# Patient Record
Sex: Male | Born: 1935 | Race: White | Hispanic: No | Marital: Married | Smoking: Former smoker
Health system: Southern US, Community
[De-identification: ages and names within clinical notes are randomized; demographics above are authoritative.]

## PROBLEM LIST (undated history)

## (undated) DIAGNOSIS — C679 Malignant neoplasm of bladder, unspecified: Secondary | ICD-10-CM

## (undated) DIAGNOSIS — I10 Essential (primary) hypertension: Secondary | ICD-10-CM

## (undated) HISTORY — DX: Malignant neoplasm of bladder, unspecified: C67.9

## (undated) HISTORY — PX: CYSTOSTOMY W/ BLADDER BIOPSY: SHX1431

---

## 2005-02-28 ENCOUNTER — Ambulatory Visit: Payer: Self-pay | Admitting: Internal Medicine

## 2005-03-18 ENCOUNTER — Ambulatory Visit (HOSPITAL_BASED_OUTPATIENT_CLINIC_OR_DEPARTMENT_OTHER): Admission: RE | Admit: 2005-03-18 | Discharge: 2005-03-18 | Payer: Self-pay | Admitting: Internal Medicine

## 2005-03-24 ENCOUNTER — Ambulatory Visit: Payer: Self-pay | Admitting: Internal Medicine

## 2005-04-01 ENCOUNTER — Ambulatory Visit: Payer: Self-pay | Admitting: Internal Medicine

## 2005-05-01 ENCOUNTER — Ambulatory Visit: Payer: Self-pay | Admitting: Internal Medicine

## 2005-08-29 ENCOUNTER — Ambulatory Visit: Payer: Self-pay | Admitting: Internal Medicine

## 2007-03-03 ENCOUNTER — Ambulatory Visit: Payer: Self-pay | Admitting: Vascular Surgery

## 2007-03-03 ENCOUNTER — Ambulatory Visit (HOSPITAL_COMMUNITY): Admission: RE | Admit: 2007-03-03 | Discharge: 2007-03-03 | Payer: Self-pay | Admitting: Family Medicine

## 2010-04-26 ENCOUNTER — Encounter: Payer: Self-pay | Admitting: Internal Medicine

## 2010-12-25 NOTE — Letter (Signed)
Summary: [facility] For Supplies / [employer]  [facility] For Supplies / [facility]   Imported By: Lennie Odor 05/01/2010 14:45:16  _____________________________________________________________________  External Attachment:    Type:   Image     Comment:   External Document

## 2013-01-01 ENCOUNTER — Other Ambulatory Visit: Payer: Self-pay | Admitting: Oncology

## 2013-01-01 DIAGNOSIS — C679 Malignant neoplasm of bladder, unspecified: Secondary | ICD-10-CM

## 2013-01-04 ENCOUNTER — Telehealth: Payer: Self-pay | Admitting: Oncology

## 2013-01-04 NOTE — Telephone Encounter (Signed)
C/D 01/04/13 for appt 01/05/13

## 2013-01-04 NOTE — Telephone Encounter (Signed)
S/W PT WIFE ON NP APPT. ON 01/05/13@10 :30 REFERRING DR MILOWSKY-[facility] DX-BLADDER CA NO NEED TO MAIL NP PACKET

## 2013-01-05 ENCOUNTER — Other Ambulatory Visit: Payer: Medicare Other

## 2013-01-05 ENCOUNTER — Encounter: Payer: Self-pay | Admitting: Oncology

## 2013-01-05 ENCOUNTER — Ambulatory Visit (HOSPITAL_BASED_OUTPATIENT_CLINIC_OR_DEPARTMENT_OTHER): Payer: Medicare Other | Admitting: Oncology

## 2013-01-05 ENCOUNTER — Ambulatory Visit: Payer: Medicare Other

## 2013-01-05 ENCOUNTER — Telehealth: Payer: Self-pay | Admitting: Oncology

## 2013-01-05 ENCOUNTER — Other Ambulatory Visit (HOSPITAL_BASED_OUTPATIENT_CLINIC_OR_DEPARTMENT_OTHER): Payer: Medicare Other | Admitting: Lab

## 2013-01-05 ENCOUNTER — Encounter: Payer: Self-pay | Admitting: *Deleted

## 2013-01-05 VITALS — BP 142/53 | HR 40 | Temp 97.2°F | Resp 20 | Ht 71.0 in | Wt 175.1 lb

## 2013-01-05 DIAGNOSIS — I498 Other specified cardiac arrhythmias: Secondary | ICD-10-CM

## 2013-01-05 DIAGNOSIS — C679 Malignant neoplasm of bladder, unspecified: Secondary | ICD-10-CM

## 2013-01-05 DIAGNOSIS — C677 Malignant neoplasm of urachus: Secondary | ICD-10-CM

## 2013-01-05 LAB — CBC WITH DIFFERENTIAL/PLATELET
Basophils Absolute: 0 10*3/uL (ref 0.0–0.1)
EOS%: 2.4 % (ref 0.0–7.0)
HCT: 39.9 % (ref 38.4–49.9)
HGB: 13.4 g/dL (ref 13.0–17.1)
MCH: 30.7 pg (ref 27.2–33.4)
MCV: 91 fL (ref 79.3–98.0)
MONO%: 9.3 % (ref 0.0–14.0)
NEUT%: 64.2 % (ref 39.0–75.0)

## 2013-01-05 LAB — COMPREHENSIVE METABOLIC PANEL (CC13)
AST: 20 U/L (ref 5–34)
Alkaline Phosphatase: 67 U/L (ref 40–150)
BUN: 20.8 mg/dL (ref 7.0–26.0)
Calcium: 10.1 mg/dL (ref 8.4–10.4)
Creatinine: 1.3 mg/dL (ref 0.7–1.3)

## 2013-01-05 MED ORDER — ONDANSETRON HCL 8 MG PO TABS: 8.0000 mg | ORAL_TABLET | Freq: Three times a day (TID) | ORAL | Status: DC | PRN

## 2013-01-05 NOTE — Progress Notes (Signed)
Checked in new pt with no financial concerns. °

## 2013-01-05 NOTE — Progress Notes (Signed)
Note dictated

## 2013-01-05 NOTE — Telephone Encounter (Signed)
gv and printed appt schedule for pt for Feb March and April....s/w and emailed michelle

## 2013-01-06 NOTE — Progress Notes (Signed)
CC:   Barbie Haggis, MD, Fax [phone] Donnella Bi, MD, Fax [phone] [facility], Fax [phone]  REASON FOR CONSULTATION:  Bladder cancer.  HISTORY OF PRESENT ILLNESS:  Mr. Michael Ball is a pleasant 77 year old gentleman currently of [place], lived the majority of his life around this area.  He is retired from [employer] and has been for quite a while. He is a relatively healthy gentleman with a history of mild hypertension.  Has also history of bradycardia and follows up with Cardiology on a regular basis.  He was in his usual state of health until he presented to his primary care physician in June of 2013 with a problem with urination.  He has had stop and start symptoms, some hesitancy at times but no gross hematuria.  A urinalysis in June of 2013 showed microscopic hematuria.  At that time he was evaluated by a urologist at [facility] and a diagnosis of superficial bladder tumor was made after a biopsy.  At that time treated with 6 treatments of BCG that concluded in November of 2013.  He had a repeat biopsy that was done on 12/23/2012.  The case number was [id number] which showed atypical urothelial cells suspicious for malignancy.  That was the cytology report.  The actual surgical pathology was done on 12/15/2012 and that was case number [id number] and showed urothelial carcinoma high-grade, papillary, noninvasive.  The lamina propria is present but without invasion of tumor.  No muscularis propria is present in the specimens. Based on these findings, the patient had a CT scan done on 12/28/2012 which showed a mildly enlarged retroperitoneal adenopathy with infrarenal aortocaval node measuring 1.4 cm and a left suprarenal periaortic lymph node measuring 1.1 cm.  Based on these findings the patient had consultation with Dr. Donnella Bi who felt that the patient has a T3 urothelial carcinoma of the bladder with possible retroperitoneal lymph node involvements;  recommended a salvage cystectomy and lymphadenectomy and possible neoadjuvant chemotherapy. The patient was also referred to Dr. Max Fickle who recommended neoadjuvant chemotherapy and the patient was referred to me for evaluation and possibly getting his chemotherapy closer to home.  Upon my interviewing Mr. Fahrney, he feels relatively fair.  He does report some intermittent hematuria but did not report any abdominal pain.  He did not report any back pain.  Had not reported any decline in his energy or performance status.  Continued to be in relatively good health and shape.  He still drives and attends to activities of daily living without any hindrance or decline.  REVIEW OF SYSTEMS:  Not reporting any headaches, blurred vision, double vision.  Not reporting any motor or sensory neuropathy.  Not reporting any alteration in mental status.  Not reporting any psychiatric issues or depression.  Not reporting any fever, chills, sweats.  Not reporting any cough, hemoptysis, hematemesis.  No nausea, vomiting.  Not reporting any abdominal pain.  No hematochezia.  No melena.  No frequency, urgency or hesitancy.  No musculoskeletal complaints.  No arthralgias, myalgias. Rest of review of systems unremarkable.  PAST MEDICAL HISTORY:  Significant for hyperlipidemia, hypertension, history of bradycardia.  Did not have any history of diabetes, coronary artery disease or any surgeries.  MEDICATIONS:  He is on amlodipine, Hyzaar, Crestor, Restoril.  ALLERGIES:  None.  SOCIAL HISTORY:  He is married.  He is retired.  He is a former smoker but quit recently.  Denies alcohol or tobacco abuse.  FAMILY HISTORY:  His mother had lung cancer.  Father  died of motor vehicle accident in his 29s.  No history of any bladder cancer.  No history of any prostate cancer.  PHYSICAL EXAMINATION:  General:  Alert, awake gentleman, appeared in no active distress.  Vital signs:  His blood pressure is 142/53, pulse  is 40, respiration 20, temperature is 97, he is afebrile.  HEENT:  Head is normocephalic, atraumatic.  Pupils equal, round, reactive to light. Oral mucosa moist and pink.  Neck:  Supple.  No lymphadenopathy.  Heart: Regular rate and rhythm.  S1, S2.  Lungs:  Clear to auscultation. Abdomen:  Soft, nontender.  No hepatosplenomegaly.  Extremities:  Had no clubbing, cyanosis or edema.  Neurological:  Intact motor and sensory and deep tendon reflexes.  LABORATORY DATA:  Showed a hemoglobin of 13, white cell count of 6.3, platelet count of 173.  His creatinine is 1.3.  He has normal liver function tests and bilirubin.  ASSESSMENT AND PLAN:  This is a pleasant 77 year old gentleman with the following findings: 1. Urothelial carcinoma of the bladder, high-grade, although it does     not appear to be muscle invasive on the recent specimen.  There is     a questionable lymphadenopathy on the CT scan in the     retroperitoneal and the periaortic area largest of which measuring     1.4 cm.  At this time I do agree with assessment that this     represents a possibly high risk bladder tumor that not only have     recurred from an in situ tumor to possibly invasive tumor that     possibly also have spread into regional lymph nodes.  I do agree     that given the aggressive nature of his cancer, neoadjuvant     chemotherapy followed by cystectomy would be a reasonable approach.     I have discussed with him the risks and benefits of using a     neoadjuvant cisplatin based chemotherapy.  Despite his age I feel     he is a very good cisplatin candidate, I think his creatinine     clearance over more than 60, he has good functional status and no     evidence of history of coronary artery disease.  All of these     parameters support the use of cisplatin at this point.  I believe     Gemzar with cisplatin would be a reasonable choice to be done on     day 1 and with Gemzar day 8 for a total of 3 cycles.   I gave him     the potential benefits between 5%-10% improvement, progression free     survival and overall survival potentially.  I also discussed with     him the role of adjuvant chemotherapy if he elects not to proceed     with neoadjuvant chemotherapy.  I explained to him that the role     has not been well established in the adjuvant setting.  The     possibility of difficulty administration of chemotherapy in this     particular setting as well as the increased complication rate.     Toxicity of this combination of chemotherapy was discussed today in     detail.  It includes nausea, vomiting, myelosuppression, infusion     related problems, renal toxicity, nephrotoxicity, auditory toxicity     and hearing loss.  More importantly neutropenia, neutropenic     sepsis, possible need for hospitalization and  intravenous     antibiotics and rarely severe illness, morbidity and death.  I also     talked to him about possible need for blood product transfusion as     well as growth factor support.  I have also discussed with him the     risks and benefits of having a Port-A-Catheter.  At this time I     think it reasonable to proceed with peripheral IVs.  He will get a     chemotherapy education class and potentially start his chemotherapy     next week.  He is agreeable obviously to proceed at this time and I     have talked to him about staging workup including a PET-CT scan.     At this time it will be difficult to get that approved given his     current diagnosis and he wanted to forego this particular step and     I think that is reasonable.  All his questions were answered today     and he was ready to proceed. 2. Bradycardia.  Again, he has been followed by his cardiologist     pretty regularly, that is Dr. Jeanella Cara, and at this time appears to     be asymptomatic.    ______________________________ Benjiman Core, M.D. FNS/MEDQ  D:  01/05/2013  T:  01/05/2013  Job:  [id number]

## 2013-01-12 ENCOUNTER — Other Ambulatory Visit: Payer: Self-pay | Admitting: *Deleted

## 2013-01-12 ENCOUNTER — Other Ambulatory Visit (HOSPITAL_BASED_OUTPATIENT_CLINIC_OR_DEPARTMENT_OTHER): Payer: Medicare Other | Admitting: Lab

## 2013-01-12 ENCOUNTER — Ambulatory Visit (HOSPITAL_BASED_OUTPATIENT_CLINIC_OR_DEPARTMENT_OTHER): Payer: 59

## 2013-01-12 VITALS — BP 128/45 | HR 44 | Temp 98.2°F

## 2013-01-12 DIAGNOSIS — C677 Malignant neoplasm of urachus: Secondary | ICD-10-CM

## 2013-01-12 DIAGNOSIS — Z5111 Encounter for antineoplastic chemotherapy: Secondary | ICD-10-CM

## 2013-01-12 DIAGNOSIS — C679 Malignant neoplasm of bladder, unspecified: Secondary | ICD-10-CM

## 2013-01-12 LAB — CBC WITH DIFFERENTIAL/PLATELET
BASO%: 0.6 % (ref 0.0–2.0)
Basophils Absolute: 0 10e3/uL (ref 0.0–0.1)
EOS%: 2 % (ref 0.0–7.0)
Eosinophils Absolute: 0.1 10e3/uL (ref 0.0–0.5)
HCT: 38.2 % — ABNORMAL LOW (ref 38.4–49.9)
HGB: 12.8 g/dL — ABNORMAL LOW (ref 13.0–17.1)
LYMPH%: 28.1 % (ref 14.0–49.0)
MCH: 30.5 pg (ref 27.2–33.4)
MCHC: 33.5 g/dL (ref 32.0–36.0)
MCV: 91 fL (ref 79.3–98.0)
MONO#: 0.7 10e3/uL (ref 0.1–0.9)
MONO%: 10.7 % (ref 0.0–14.0)
NEUT#: 4 10e3/uL (ref 1.5–6.5)
NEUT%: 58.6 % (ref 39.0–75.0)
Platelets: 168 10e3/uL (ref 140–400)
RBC: 4.2 10e6/uL (ref 4.20–5.82)
RDW: 13.7 % (ref 11.0–14.6)
WBC: 6.8 10e3/uL (ref 4.0–10.3)
lymph#: 1.9 10e3/uL (ref 0.9–3.3)
nRBC: 0 % (ref 0–0)

## 2013-01-12 LAB — COMPREHENSIVE METABOLIC PANEL (CC13)
Albumin: 4 g/dL (ref 3.5–5.0)
Alkaline Phosphatase: 70 U/L (ref 40–150)
CO2: 26 mEq/L (ref 22–29)
Chloride: 105 mEq/L (ref 98–107)
Potassium: 3.5 mEq/L (ref 3.5–5.1)
Sodium: 143 mEq/L (ref 136–145)

## 2013-01-12 MED ORDER — SODIUM CHLORIDE 0.9 % IV SOLN
70.0000 mg/m2 | Freq: Once | INTRAVENOUS | Status: AC
  Administered 2013-01-12: 140 mg via INTRAVENOUS
  Filled 2013-01-12: qty 140

## 2013-01-12 MED ORDER — SODIUM CHLORIDE 0.9 % IV SOLN
1000.0000 mg/m2 | Freq: Once | INTRAVENOUS | Status: AC
  Administered 2013-01-12: 1976 mg via INTRAVENOUS
  Filled 2013-01-12: qty 52

## 2013-01-12 MED ORDER — PALONOSETRON HCL INJECTION 0.25 MG/5ML
0.2500 mg | Freq: Once | INTRAVENOUS | Status: AC
  Administered 2013-01-12: 0.25 mg via INTRAVENOUS

## 2013-01-12 MED ORDER — PROCHLORPERAZINE MALEATE 10 MG PO TABS: 10.0000 mg | ORAL_TABLET | Freq: Four times a day (QID) | ORAL | Status: DC | PRN

## 2013-01-12 MED ORDER — SODIUM CHLORIDE 0.9 % IV SOLN
INTRAVENOUS | Status: DC
  Administered 2013-01-12: 10:00:00 via INTRAVENOUS

## 2013-01-12 MED ORDER — SODIUM CHLORIDE 0.9 % IV SOLN
150.0000 mg | Freq: Once | INTRAVENOUS | Status: AC
  Administered 2013-01-12: 150 mg via INTRAVENOUS
  Filled 2013-01-12: qty 5

## 2013-01-12 MED ORDER — DEXAMETHASONE SODIUM PHOSPHATE 4 MG/ML IJ SOLN
12.0000 mg | Freq: Once | INTRAMUSCULAR | Status: AC
  Administered 2013-01-12: 12 mg via INTRAVENOUS

## 2013-01-12 MED ORDER — POTASSIUM CHLORIDE 2 MEQ/ML IV SOLN
Freq: Once | INTRAVENOUS | Status: AC
  Administered 2013-01-12: 10:00:00 via INTRAVENOUS
  Filled 2013-01-12: qty 10

## 2013-01-12 NOTE — Progress Notes (Signed)
Dr. Clelia Croft notified of pt's BP and HR today, verbal order received and read back ok to proceed with treatment.  Pt states his BP medication may need to be adjusted since the diastolic is becoming lower, he has noticed.  Informed pt he could call his primary care MD office to discuss this further.  He verbalizes understanding.

## 2013-01-12 NOTE — Patient Instructions (Addendum)
 Cancer Center Discharge Instructions for Patients Receiving Chemotherapy  Today you received the following chemotherapy agents: Gemzar, Cisplatin  To help prevent nausea and vomiting after your treatment, we encourage you to take your nausea medication as directed by your MD.  If you develop nausea and vomiting that is not controlled by your nausea medication, call the clinic. If it is after clinic hours your family physician or the after hours number for the clinic or go to the Emergency Department.   BELOW ARE SYMPTOMS THAT SHOULD BE REPORTED IMMEDIATELY:  *FEVER GREATER THAN 100.5 F  *CHILLS WITH OR WITHOUT FEVER  NAUSEA AND VOMITING THAT IS NOT CONTROLLED WITH YOUR NAUSEA MEDICATION  *UNUSUAL SHORTNESS OF BREATH  *UNUSUAL BRUISING OR BLEEDING  TENDERNESS IN MOUTH AND THROAT WITH OR WITHOUT PRESENCE OF ULCERS  *URINARY PROBLEMS  *BOWEL PROBLEMS  UNUSUAL RASH Items with * indicate a potential emergency and should be followed up as soon as possible.  One of the nurses will contact you 24 hours after your treatment. Please let the nurse know about any problems that you may have experienced. Feel free to call the clinic you have any questions or concerns. The clinic phone number is [phone].

## 2013-01-12 NOTE — Progress Notes (Signed)
1230- pre-cisplatin- Pt has voided 475 ml urine, and went an additional time that he forgot to measure, thinks it was about 150 ml.

## 2013-01-13 ENCOUNTER — Telehealth: Payer: Self-pay | Admitting: *Deleted

## 2013-01-13 NOTE — Telephone Encounter (Signed)
Message copied by Augusto Garbe on Wed Jan 13, 2013 11:35 AM ------      Message from: Faith Rogue F      Created: Tue Jan 12, 2013  1:49 PM      Regarding: chemo f/u call       Gemzar, Cisplatin            Dr. Clelia Croft             ------

## 2013-01-13 NOTE — Telephone Encounter (Signed)
Patient is doing well.  Denies any side effects or symptoms.  Reports Eating and drinking lots of water.  "No change or problem other than normal with bladder and bowels."  Not emptying as often but I am going.  No pain or burning.  Reports history of ringing in his ears for years.  Denies trouble hearing.  Has an open room in his house and has walked a few laps for exercise.  Asked about his Bowling league.  Plans to bowl Friday.  Says he will keep everything clean and avoid the crowds, has his own ball he wipes down with alcohol.  Denies any further questions.  Asked that he call if needed otherwise we'll see him 01-19-2013.

## 2013-01-19 ENCOUNTER — Ambulatory Visit (HOSPITAL_BASED_OUTPATIENT_CLINIC_OR_DEPARTMENT_OTHER): Payer: Medicare Other

## 2013-01-19 ENCOUNTER — Ambulatory Visit (HOSPITAL_BASED_OUTPATIENT_CLINIC_OR_DEPARTMENT_OTHER): Payer: Medicare Other | Admitting: Oncology

## 2013-01-19 ENCOUNTER — Other Ambulatory Visit (HOSPITAL_BASED_OUTPATIENT_CLINIC_OR_DEPARTMENT_OTHER): Payer: Medicare Other | Admitting: Lab

## 2013-01-19 ENCOUNTER — Encounter: Payer: Self-pay | Admitting: Oncology

## 2013-01-19 DIAGNOSIS — C679 Malignant neoplasm of bladder, unspecified: Secondary | ICD-10-CM

## 2013-01-19 DIAGNOSIS — Z5111 Encounter for antineoplastic chemotherapy: Secondary | ICD-10-CM

## 2013-01-19 LAB — COMPREHENSIVE METABOLIC PANEL (CC13)
Albumin: 3.7 g/dL (ref 3.5–5.0)
CO2: 26 mEq/L (ref 22–29)
Calcium: 9.3 mg/dL (ref 8.4–10.4)
Glucose: 95 mg/dl (ref 70–99)
Potassium: 3.6 mEq/L (ref 3.5–5.1)
Sodium: 138 mEq/L (ref 136–145)
Total Bilirubin: 0.82 mg/dL (ref 0.20–1.20)
Total Protein: 7.1 g/dL (ref 6.4–8.3)

## 2013-01-19 LAB — CBC WITH DIFFERENTIAL/PLATELET
Basophils Absolute: 0 10*3/uL (ref 0.0–0.1)
Eosinophils Absolute: 0.1 10*3/uL (ref 0.0–0.5)
HGB: 12.3 g/dL — ABNORMAL LOW (ref 13.0–17.1)
MCV: 88.4 fL (ref 79.3–98.0)
MONO%: 5 % (ref 0.0–14.0)
NEUT#: 2.1 10*3/uL (ref 1.5–6.5)
RDW: 13.2 % (ref 11.0–14.6)

## 2013-01-19 MED ORDER — SODIUM CHLORIDE 0.9 % IV SOLN
Freq: Once | INTRAVENOUS | Status: AC
  Administered 2013-01-19: 10:00:00 via INTRAVENOUS

## 2013-01-19 MED ORDER — ONDANSETRON 8 MG/50ML IVPB (CHCC)
8.0000 mg | Freq: Once | INTRAVENOUS | Status: AC
  Administered 2013-01-19: 8 mg via INTRAVENOUS

## 2013-01-19 MED ORDER — SODIUM CHLORIDE 0.9 % IV SOLN
1000.0000 mg/m2 | Freq: Once | INTRAVENOUS | Status: AC
  Administered 2013-01-19: 1976 mg via INTRAVENOUS
  Filled 2013-01-19: qty 52.03

## 2013-01-19 MED ORDER — DEXAMETHASONE SODIUM PHOSPHATE 10 MG/ML IJ SOLN
10.0000 mg | Freq: Once | INTRAMUSCULAR | Status: AC
  Administered 2013-01-19: 10 mg via INTRAVENOUS

## 2013-01-19 NOTE — Patient Instructions (Addendum)
[facility] Discharge Instructions for Patients Receiving Chemotherapy  Today you received the following chemotherapy agent Gemzar  To help prevent nausea and vomiting after your treatment, we encourage you to take your nausea medication. Begin taking your nausea medication as often as prescribed for by Dr. Clelia Croft.   If you develop nausea and vomiting that is not controlled by your nausea medication, call the clinic. If it is after clinic hours your family physician or the after hours number for the clinic or go to the Emergency Department.   BELOW ARE SYMPTOMS THAT SHOULD BE REPORTED IMMEDIATELY:  *FEVER GREATER THAN 100.5 F  *CHILLS WITH OR WITHOUT FEVER  NAUSEA AND VOMITING THAT IS NOT CONTROLLED WITH YOUR NAUSEA MEDICATION  *UNUSUAL SHORTNESS OF BREATH  *UNUSUAL BRUISING OR BLEEDING  TENDERNESS IN MOUTH AND THROAT WITH OR WITHOUT PRESENCE OF ULCERS  *URINARY PROBLEMS  *BOWEL PROBLEMS  UNUSUAL RASH Items with * indicate a potential emergency and should be followed up as soon as possible.  One of the nurses will contact you 24 hours after your treatment. Please let the nurse know about any problems that you may have experienced. Feel free to call the clinic you have any questions or concerns. The clinic phone number is [phone].   I have been informed and understand all the instructions given to me. I know to contact the clinic, my physician, or go to the Emergency Department if any problems should occur. I do not have any questions at this time, but understand that I may call the clinic during office hours   should I have any questions or need assistance in obtaining follow up care.    __________________________________________  _____________  __________ Signature of Patient or Authorized Representative            Date                   Time    __________________________________________ Nurse's Signature

## 2013-01-19 NOTE — Progress Notes (Signed)
Hematology and Oncology Follow Up Visit  Michael Ball [id number] 18-Sep-1936 77 y.o. 01/20/2013 11:20 AM No primary provider on file.No ref. provider found   Principle Diagnosis: Urothelial carcinoma of the bladder, high-grade, although it does not appear to be muscle invasive on the recent specimen.  Prior Therapy: He received 6 treatments of BCG that concluded in November of 2013. He had a repeat biopsy that was  done on 12/23/2012. The case number was [id number] which showed atypical urothelial cells suspicious for malignancy. That was the cytology report. The actual surgical pathology was done on 12/15/2012 and that was case number [id number] and showed urothelial carcinoma high-grade, papillary, noninvasive. The lamina propria is present but without  invasion of tumor.   Current therapy: Cisplatin and Gemzar started on 01/12/13. He is here for cycle 1 day 8.  Interim History:  Michael Ball is seen for routine follow-up today prior to chemo. He tolerated first chemo well. He has mild nausea without vomiting. Denies chest pain, SOB, DOE. No abdominal pain. States hemturia has now completely resolved. He has not other bleeding. He has mild fatigue, but is still able to go bowling.   Medications: I have reviewed the patient's current medications. Current outpatient prescriptions:amLODipine (NORVASC) 10 MG tablet, Take 10 mg by mouth daily., Disp: , Rfl: ;  losartan-hydrochlorothiazide (HYZAAR) 100-25 MG per tablet, Take 1 tablet by mouth daily., Disp: , Rfl: ;  ondansetron (ZOFRAN) 8 MG tablet, Take 1 tablet (8 mg total) by mouth every 8 (eight) hours as needed for nausea., Disp: 20 tablet, Rfl: 0 prochlorperazine (COMPAZINE) 10 MG tablet, Take 1 tablet (10 mg total) by mouth every 6 (six) hours as needed., Disp: 30 tablet, Rfl: 1;  rosuvastatin (CRESTOR) 20 MG tablet, Take 20 mg by mouth daily., Disp: , Rfl: ;  temazepam (RESTORIL) 30 MG capsule, Take 1 capsule by mouth at bedtime as needed., Disp: ,  Rfl:   Allergies: No Known Allergies  Past Medical History, Surgical history, Social history, and Family History were reviewed and updated.  Review of Systems: Constitutional:  Negative for fever, chills, night sweats, anorexia, weight loss, pain. Cardiovascular: no chest pain or dyspnea on exertion Respiratory: no cough, shortness of breath, or wheezing Neurological: no TIA or stroke symptoms Dermatological: negative ENT: negative Skin: Negative. Gastrointestinal: no abdominal pain, change in bowel habits, or black or bloody stools Genito-Urinary: no dysuria, trouble voiding, or hematuria Hematological and Lymphatic: negative Breast: negative for breast lumps Musculoskeletal: negative Remaining ROS negative.  Physical Exam: There were no vitals taken for this visit. ECOG: 1 General appearance: alert, cooperative and no distress Head: Normocephalic, without obvious abnormality, atraumatic Neck: no adenopathy, no carotid bruit, no JVD, supple, symmetrical, trachea midline and thyroid not enlarged, symmetric, no tenderness/mass/nodules Lymph nodes: Cervical, supraclavicular, and axillary nodes normal. Heart:regular rate and rhythm, S1, S2 normal, no murmur, click, rub or gallop Lung:chest clear, no wheezing, rales, normal symmetric air entry, no tachypnea, retractions or cyanosis Abdomen: soft, non-tender, without masses or organomegaly EXT:no erythema, induration, or nodules   Lab Results: Lab Results  Component Value Date   WBC 4.2 01/19/2013   HGB 12.3* 01/19/2013   HCT 35.9* 01/19/2013   MCV 88.4 01/19/2013   PLT 120* 01/19/2013     Chemistry      Component Value Date/Time   NA 138 01/19/2013 0857   K 3.6 01/19/2013 0857   CL 100 01/19/2013 0857   CO2 26 01/19/2013 0857   BUN 51.0* 01/19/2013 0857   CREATININE 2.3*  01/19/2013 0857      Component Value Date/Time   CALCIUM 9.3 01/19/2013 0857   ALKPHOS 61 01/19/2013 0857   AST 25 01/19/2013 0857   ALT 43 01/19/2013 0857    BILITOT 0.82 01/19/2013 0857     Impression and Plan: This is a 77 year old male with the following issues: 1. Urothelial carcinoma of the bladder, high grade. Currently on neoadjuvant chemo with Cisplatin and Gemzar. He is here for cycle 1 day 8. Recommend that he proceed with chemo today without dose modification. 2. Bradycardia. He is followed by Cardiology. 3. Nausea/vomiting prophylaxis. He has Zofran and Compazine at home. 4. Follow up in 2 weeks for cycle 2 day 1.  Spent more than half the time coordinating care.    [place], [place] 2/26/201411:20 AM

## 2013-01-21 ENCOUNTER — Ambulatory Visit: Payer: Medicare Other | Admitting: Audiology

## 2013-02-02 ENCOUNTER — Other Ambulatory Visit (HOSPITAL_BASED_OUTPATIENT_CLINIC_OR_DEPARTMENT_OTHER): Payer: Medicare Other | Admitting: Lab

## 2013-02-02 ENCOUNTER — Ambulatory Visit (HOSPITAL_BASED_OUTPATIENT_CLINIC_OR_DEPARTMENT_OTHER): Payer: Medicare Other | Admitting: Oncology

## 2013-02-02 ENCOUNTER — Encounter: Payer: Self-pay | Admitting: Oncology

## 2013-02-02 ENCOUNTER — Ambulatory Visit (HOSPITAL_BASED_OUTPATIENT_CLINIC_OR_DEPARTMENT_OTHER): Payer: Medicare Other

## 2013-02-02 DIAGNOSIS — C677 Malignant neoplasm of urachus: Secondary | ICD-10-CM

## 2013-02-02 DIAGNOSIS — Z5111 Encounter for antineoplastic chemotherapy: Secondary | ICD-10-CM

## 2013-02-02 LAB — CBC WITH DIFFERENTIAL/PLATELET
Basophils Absolute: 0 10*3/uL (ref 0.0–0.1)
Eosinophils Absolute: 0.1 10*3/uL (ref 0.0–0.5)
MCH: 30.7 pg (ref 27.2–33.4)
MCHC: 34.3 g/dL (ref 32.0–36.0)
MCV: 89.7 fL (ref 79.3–98.0)
MONO#: 0.6 10*3/uL (ref 0.1–0.9)
NEUT%: 44.8 % (ref 39.0–75.0)
Platelets: 179 10*3/uL (ref 140–400)
RDW: 13.6 % (ref 11.0–14.6)
lymph#: 1.4 10*3/uL (ref 0.9–3.3)

## 2013-02-02 LAB — COMPREHENSIVE METABOLIC PANEL (CC13)
ALT: 17 U/L (ref 0–55)
Albumin: 3.7 g/dL (ref 3.5–5.0)
CO2: 24 mEq/L (ref 22–29)
Glucose: 93 mg/dl (ref 70–99)
Potassium: 4 mEq/L (ref 3.5–5.1)
Sodium: 143 mEq/L (ref 136–145)
Total Protein: 7.5 g/dL (ref 6.4–8.3)

## 2013-02-02 LAB — MAGNESIUM (CC13): Magnesium: 2.2 mg/dl (ref 1.5–2.5)

## 2013-02-02 MED ORDER — DEXAMETHASONE SODIUM PHOSPHATE 4 MG/ML IJ SOLN
12.0000 mg | Freq: Once | INTRAMUSCULAR | Status: AC
  Administered 2013-02-02: 12 mg via INTRAVENOUS

## 2013-02-02 MED ORDER — SODIUM CHLORIDE 0.9 % IV SOLN
1000.0000 mg/m2 | Freq: Once | INTRAVENOUS | Status: AC
  Administered 2013-02-02: 1976 mg via INTRAVENOUS
  Filled 2013-02-02: qty 52

## 2013-02-02 MED ORDER — POTASSIUM CHLORIDE 2 MEQ/ML IV SOLN
Freq: Once | INTRAVENOUS | Status: AC
  Administered 2013-02-02: 11:00:00 via INTRAVENOUS
  Filled 2013-02-02: qty 10

## 2013-02-02 MED ORDER — CISPLATIN CHEMO INJECTION 100MG/100ML
70.0000 mg/m2 | Freq: Once | INTRAVENOUS | Status: AC
  Administered 2013-02-02: 139 mg via INTRAVENOUS
  Filled 2013-02-02: qty 139

## 2013-02-02 MED ORDER — PALONOSETRON HCL INJECTION 0.25 MG/5ML
0.2500 mg | Freq: Once | INTRAVENOUS | Status: AC
  Administered 2013-02-02: 0.25 mg via INTRAVENOUS

## 2013-02-02 MED ORDER — SODIUM CHLORIDE 0.9 % IV SOLN
INTRAVENOUS | Status: DC
  Administered 2013-02-02: 10:00:00 via INTRAVENOUS

## 2013-02-02 MED ORDER — SODIUM CHLORIDE 0.9 % IV SOLN
150.0000 mg | Freq: Once | INTRAVENOUS | Status: AC
  Administered 2013-02-02: 150 mg via INTRAVENOUS
  Filled 2013-02-02: qty 5

## 2013-02-02 NOTE — Progress Notes (Signed)
Hematology and Oncology Follow Up Visit  Michael Ball [id number] [birth year] 77 y.o. 02/02/2013 10:30 AM No primary provider on file.No ref. provider found   Principle Diagnosis: Urothelial carcinoma of the bladder, high-grade, although it does not appear to be muscle invasive on the recent specimen.  Prior Therapy: He received 6 treatments of BCG that concluded in November of 2013. He had a repeat biopsy that was  done on 12/23/2012. The case number was [id number] which showed atypical urothelial cells suspicious for malignancy. That was the cytology report. The actual surgical pathology was done on 12/15/2012 and that was case number [id number] and showed urothelial carcinoma high-grade, papillary, noninvasive. The lamina propria is present but without  invasion of tumor.   Current therapy: Cisplatin and Gemzar started on 01/12/13. He is here for cycle 2 day 1.  Interim History:  Mr Wenig is seen for routine follow-up today prior to chemo. He continues to tolerate chemo well. Using anti-emetics routinely. Denies chest pain, SOB, DOE. No abdominal pain. States hemturia has now completely resolved. He has not other bleeding. He has mild fatigue, but is still able to go bowling.   Medications: I have reviewed the patient's current medications. Current outpatient prescriptions:amLODipine (NORVASC) 10 MG tablet, Take 5 mg by mouth daily. , Disp: , Rfl: ;  losartan-hydrochlorothiazide (HYZAAR) 100-25 MG per tablet, Take 1 tablet by mouth daily., Disp: , Rfl: ;  ondansetron (ZOFRAN) 8 MG tablet, Take 1 tablet (8 mg total) by mouth every 8 (eight) hours as needed for nausea., Disp: 20 tablet, Rfl: 0 prochlorperazine (COMPAZINE) 10 MG tablet, Take 1 tablet (10 mg total) by mouth every 6 (six) hours as needed., Disp: 30 tablet, Rfl: 1;  rosuvastatin (CRESTOR) 20 MG tablet, Take 20 mg by mouth daily., Disp: , Rfl: ;  temazepam (RESTORIL) 30 MG capsule, Take 1 capsule by mouth at bedtime as needed., Disp: ,  Rfl:  No current facility-administered medications for this visit. Facility-Administered Medications Ordered in Other Visits: 0.9 %  sodium chloride infusion, , Intravenous, Continuous, Benjiman Core, MD, Last Rate: 20 mL/hr at 02/02/13 1025;  dextrose 5 % and 0.45% NaCl 1,000 mL with potassium chloride 20 mEq, magnesium sulfate 12 mEq, mannitol 12.5 g infusion, , Intravenous, Once, Benjiman Core, MD  Allergies: No Known Allergies  Past Medical History, Surgical history, Social history, and Family History were reviewed and updated.  Review of Systems: Constitutional:  Negative for fever, chills, night sweats, anorexia, weight loss, pain. Cardiovascular: no chest pain or dyspnea on exertion Respiratory: no cough, shortness of breath, or wheezing Neurological: no TIA or stroke symptoms Dermatological: negative ENT: negative Skin: Negative. Gastrointestinal: no abdominal pain, change in bowel habits, or black or bloody stools Genito-Urinary: no dysuria, trouble voiding, or hematuria Hematological and Lymphatic: negative Breast: negative for breast lumps Musculoskeletal: negative Remaining ROS negative.  Physical Exam: There were no vitals taken for this visit. ECOG: 1 General appearance: alert, cooperative and no distress Head: Normocephalic, without obvious abnormality, atraumatic Neck: no adenopathy, no carotid bruit, no JVD, supple, symmetrical, trachea midline and thyroid not enlarged, symmetric, no tenderness/mass/nodules Lymph nodes: Cervical, supraclavicular, and axillary nodes normal. Heart:regular rate and rhythm, S1, S2 normal, no murmur, click, rub or gallop Lung:chest clear, no wheezing, rales, normal symmetric air entry, no tachypnea, retractions or cyanosis Abdomen: soft, non-tender, without masses or organomegaly EXT:no erythema, induration, or nodules   Lab Results: Lab Results  Component Value Date   WBC 3.7* 02/02/2013   HGB 11.0* 02/02/2013  HCT 32.1*  02/02/2013   MCV 89.7 02/02/2013   PLT 179 02/02/2013     Chemistry      Component Value Date/Time   NA 138 01/19/2013 0857   K 3.6 01/19/2013 0857   CL 100 01/19/2013 0857   CO2 26 01/19/2013 0857   BUN 51.0* 01/19/2013 0857   CREATININE 2.3* 01/19/2013 0857      Component Value Date/Time   CALCIUM 9.3 01/19/2013 0857   ALKPHOS 61 01/19/2013 0857   AST 25 01/19/2013 0857   ALT 43 01/19/2013 0857   BILITOT 0.82 01/19/2013 0857     Impression and Plan: This is a 77 year old male with the following issues: 1. Urothelial carcinoma of the bladder, high grade. Currently on neoadjuvant chemo with Cisplatin and Gemzar. He is here for cycle 2 day 1. Recommend that he proceed with chemo today without dose modification. 2. Bradycardia. He is followed by Cardiology. 3. Nausea/vomiting prophylaxis. He has Zofran and Compazine at home. 4. Follow up in 1 week for cycle 2 day 8.  Spent more than half the time coordinating care.    CURCIO, KRISTIN 3/11/201410:30 AM

## 2013-02-02 NOTE — Patient Instructions (Addendum)
[facility] Discharge Instructions for Patients Receiving Chemotherapy  Today you received the following chemotherapy agents: gemzar, cisplatin  To help prevent nausea and vomiting after your treatment, we encourage you to take your nausea medication.  Take it as often as prescribed.     If you develop nausea and vomiting that is not controlled by your nausea medication, call the clinic. If it is after clinic hours your family physician or the after hours number for the clinic or go to the Emergency Department.   BELOW ARE SYMPTOMS THAT SHOULD BE REPORTED IMMEDIATELY:  *FEVER GREATER THAN 100.5 F  *CHILLS WITH OR WITHOUT FEVER  NAUSEA AND VOMITING THAT IS NOT CONTROLLED WITH YOUR NAUSEA MEDICATION  *UNUSUAL SHORTNESS OF BREATH  *UNUSUAL BRUISING OR BLEEDING  TENDERNESS IN MOUTH AND THROAT WITH OR WITHOUT PRESENCE OF ULCERS  *URINARY PROBLEMS  *BOWEL PROBLEMS  UNUSUAL RASH Items with * indicate a potential emergency and should be followed up as soon as possible.  Feel free to call the clinic you have any questions or concerns. The clinic phone number is [phone].   I have been informed and understand all the instructions given to me. I know to contact the clinic, my physician, or go to the Emergency Department if any problems should occur. I do not have any questions at this time, but understand that I may call the clinic during office hours   should I have any questions or need assistance in obtaining follow up care.    __________________________________________  _____________  __________ Signature of Patient or Authorized Representative            Date                   Time    __________________________________________ Nurse's Signature    

## 2013-02-09 ENCOUNTER — Ambulatory Visit (HOSPITAL_BASED_OUTPATIENT_CLINIC_OR_DEPARTMENT_OTHER): Payer: Medicare Other | Admitting: Oncology

## 2013-02-09 ENCOUNTER — Other Ambulatory Visit (HOSPITAL_BASED_OUTPATIENT_CLINIC_OR_DEPARTMENT_OTHER): Payer: Medicare Other | Admitting: Lab

## 2013-02-09 ENCOUNTER — Encounter: Payer: Self-pay | Admitting: Oncology

## 2013-02-09 ENCOUNTER — Ambulatory Visit (HOSPITAL_BASED_OUTPATIENT_CLINIC_OR_DEPARTMENT_OTHER): Payer: Medicare Other

## 2013-02-09 VITALS — BP 151/57 | HR 47 | Temp 97.4°F | Resp 18 | Ht 71.0 in | Wt 171.2 lb

## 2013-02-09 DIAGNOSIS — Z5111 Encounter for antineoplastic chemotherapy: Secondary | ICD-10-CM

## 2013-02-09 DIAGNOSIS — C679 Malignant neoplasm of bladder, unspecified: Secondary | ICD-10-CM

## 2013-02-09 LAB — CBC WITH DIFFERENTIAL/PLATELET
BASO%: 0.8 % (ref 0.0–2.0)
LYMPH%: 38.9 % (ref 14.0–49.0)
MCHC: 34.4 g/dL (ref 32.0–36.0)
MONO#: 0.2 10*3/uL (ref 0.1–0.9)
MONO%: 5.4 % (ref 0.0–14.0)
NEUT#: 2 10*3/uL (ref 1.5–6.5)
Platelets: 259 10*3/uL (ref 140–400)
RBC: 3.78 10*6/uL — ABNORMAL LOW (ref 4.20–5.82)
RDW: 13.5 % (ref 11.0–14.6)
WBC: 3.7 10*3/uL — ABNORMAL LOW (ref 4.0–10.3)
nRBC: 0 % (ref 0–0)

## 2013-02-09 LAB — COMPREHENSIVE METABOLIC PANEL (CC13)
ALT: 40 U/L (ref 0–55)
Albumin: 3.8 g/dL (ref 3.5–5.0)
CO2: 27 mEq/L (ref 22–29)
Chloride: 104 mEq/L (ref 98–107)
Glucose: 91 mg/dl (ref 70–99)
Potassium: 4 mEq/L (ref 3.5–5.1)
Sodium: 140 mEq/L (ref 136–145)
Total Bilirubin: 0.37 mg/dL (ref 0.20–1.20)
Total Protein: 7.4 g/dL (ref 6.4–8.3)

## 2013-02-09 MED ORDER — SODIUM CHLORIDE 0.9 % IV SOLN
Freq: Once | INTRAVENOUS | Status: AC
  Administered 2013-02-09: 10:00:00 via INTRAVENOUS

## 2013-02-09 MED ORDER — ONDANSETRON HCL 8 MG PO TABS: 8.0000 mg | ORAL_TABLET | Freq: Three times a day (TID) | ORAL | Status: DC | PRN

## 2013-02-09 MED ORDER — ONDANSETRON 8 MG/50ML IVPB (CHCC)
8.0000 mg | Freq: Once | INTRAVENOUS | Status: AC
  Administered 2013-02-09: 8 mg via INTRAVENOUS

## 2013-02-09 MED ORDER — LORAZEPAM 0.5 MG PO TABS: 0.5000 mg | ORAL_TABLET | Freq: Three times a day (TID) | ORAL | Status: DC

## 2013-02-09 MED ORDER — SODIUM CHLORIDE 0.9 % IV SOLN
1000.0000 mg/m2 | Freq: Once | INTRAVENOUS | Status: AC
  Administered 2013-02-09: 1976 mg via INTRAVENOUS
  Filled 2013-02-09: qty 52

## 2013-02-09 MED ORDER — DEXAMETHASONE SODIUM PHOSPHATE 10 MG/ML IJ SOLN
10.0000 mg | Freq: Once | INTRAMUSCULAR | Status: AC
  Administered 2013-02-09: 10 mg via INTRAVENOUS

## 2013-02-09 NOTE — Progress Notes (Signed)
Hematology and Oncology Follow Up Visit  Michael Ball [id number] February 24, 1936 77 y.o. 02/09/2013 3:11 PM MichaelCAMILLE Ball, MDNo ref. provider found   Principle Diagnosis: Urothelial carcinoma of the bladder, high-grade, although it does not appear to be muscle invasive on the recent specimen.  Prior Therapy: He received 6 treatments of BCG that concluded in November of 2013. He had a repeat biopsy that was  done on 12/23/2012. The case number was [id number] which showed atypical urothelial cells suspicious for malignancy. That was the cytology report. The actual surgical pathology was done on 12/15/2012 and that was case number [id number] and showed urothelial carcinoma high-grade, papillary, noninvasive. The lamina propria is present but without  invasion of tumor.   Current therapy: Cisplatin and Gemzar started on 01/12/13. He is here for cycle 2 day 8.  Interim History:  Michael Ball is seen for routine follow-up today prior to chemo. He continues to tolerate chemo well. Using anti-emetics routinely, but states Compazine is making his feel anxious. He is not using Compazine routinely. Nausea is overall controlled, but feels queasy at times. No vomiting. Denies chest pain, SOB, DOE. No abdominal pain. States hematuria has now completely resolved. He has not other bleeding. He has mild fatigue, but is still able to go bowling.   Medications: I have reviewed the patient's current medications. Current outpatient prescriptions:amLODipine (NORVASC) 10 MG tablet, Take 5 mg by mouth daily. , Disp: , Rfl: ;  LORazepam (ATIVAN) 0.5 MG tablet, Take 1 tablet (0.5 mg total) by mouth every 8 (eight) hours., Disp: 30 tablet, Rfl: 1;  losartan-hydrochlorothiazide (HYZAAR) 100-25 MG per tablet, Take 1 tablet by mouth daily., Disp: , Rfl:  ondansetron (ZOFRAN) 8 MG tablet, Take 1 tablet (8 mg total) by mouth every 8 (eight) hours as needed for nausea., Disp: 20 tablet, Rfl: 2;  prochlorperazine (COMPAZINE) 10 MG tablet,  Take 1 tablet (10 mg total) by mouth every 6 (six) hours as needed., Disp: 30 tablet, Rfl: 1;  rosuvastatin (CRESTOR) 20 MG tablet, Take 20 mg by mouth daily., Disp: , Rfl:  temazepam (RESTORIL) 30 MG capsule, Take 1 capsule by mouth at bedtime as needed., Disp: , Rfl:   Allergies: No Known Allergies  Past Medical History, Surgical history, Social history, and Family History were reviewed and updated.  Review of Systems: Constitutional:  Negative for fever, chills, night sweats, anorexia, weight loss, pain. Cardiovascular: no chest pain or dyspnea on exertion Respiratory: no cough, shortness of breath, or wheezing Neurological: no TIA or stroke symptoms Dermatological: negative ENT: negative Skin: Negative. Gastrointestinal: no abdominal pain, change in bowel habits, or black or bloody stools Genito-Urinary: no dysuria, trouble voiding, or hematuria Hematological and Lymphatic: negative Breast: negative for breast lumps Musculoskeletal: negative Remaining ROS negative.  Physical Exam: Blood pressure 151/57, pulse 47, temperature 97.4 F (36.3 C), temperature source Oral, resp. rate 18, height 5\' 11"  (1.803 m), weight 171 lb 3.2 oz (77.656 kg). ECOG: 1 General appearance: alert, cooperative and no distress Head: Normocephalic, without obvious abnormality, atraumatic Neck: no adenopathy, no carotid bruit, no JVD, supple, symmetrical, trachea midline and thyroid not enlarged, symmetric, no tenderness/mass/nodules Lymph nodes: Cervical, supraclavicular, and axillary nodes normal. Heart:regular rate and rhythm, S1, S2 normal, no murmur, click, rub or gallop Lung:chest clear, no wheezing, rales, normal symmetric air entry, no tachypnea, retractions or cyanosis Abdomen: soft, non-tender, without masses or organomegaly EXT:no erythema, induration, or nodules   Lab Results: Lab Results  Component Value Date   WBC 3.7* 02/09/2013   HGB  11.6* 02/09/2013   HCT 33.7* 02/09/2013   MCV 89.2  02/09/2013   PLT 259 02/09/2013     Chemistry      Component Value Date/Time   NA 140 02/09/2013 0830   K 4.0 02/09/2013 0830   CL 104 02/09/2013 0830   CO2 27 02/09/2013 0830   BUN 38.9* 02/09/2013 0830   CREATININE 2.1* 02/09/2013 0830      Component Value Date/Time   CALCIUM 9.6 02/09/2013 0830   ALKPHOS 69 02/09/2013 0830   AST 29 02/09/2013 0830   ALT 40 02/09/2013 0830   BILITOT 0.37 02/09/2013 0830     Impression and Plan: This is a 77 year old male with the following issues: 1. Urothelial carcinoma of the bladder, high grade. Currently on neoadjuvant chemo with Cisplatin and Gemzar. He is here for cycle 2 day 8. Recommend that he proceed with chemo today without dose modification. 2. Bradycardia. He is followed by Cardiology. 3. Nausea/vomiting prophylaxis. He has Zofran and Compazine at home. Compazine makes him feel anxious. I have given him Ativan for breakthrough nausea. 4. Follow up in 2 weeks for cycle 3 day 1.  Spent more than half the time coordinating care.    Clenton Pare 3/18/20143:11 PM

## 2013-02-09 NOTE — Patient Instructions (Signed)
[facility] Discharge Instructions for Patients Receiving Chemotherapy  Today you received the following chemotherapy agents Gemzar  To help prevent nausea and vomiting after your treatment, we encourage you to take your nausea medication as prescribed. If you develop nausea and vomiting that is not controlled by your nausea medication, call the clinic. If it is after clinic hours your family physician or the after hours number for the clinic or go to the Emergency Department.   BELOW ARE SYMPTOMS THAT SHOULD BE REPORTED IMMEDIATELY:  *FEVER GREATER THAN 100.5 F  *CHILLS WITH OR WITHOUT FEVER  NAUSEA AND VOMITING THAT IS NOT CONTROLLED WITH YOUR NAUSEA MEDICATION  *UNUSUAL SHORTNESS OF BREATH  *UNUSUAL BRUISING OR BLEEDING  TENDERNESS IN MOUTH AND THROAT WITH OR WITHOUT PRESENCE OF ULCERS  *URINARY PROBLEMS  *BOWEL PROBLEMS  UNUSUAL RASH Items with * indicate a potential emergency and should be followed up as soon as possible.  One of the nurses will contact you 24 hours after your treatment. Please let the nurse know about any problems that you may have experienced. Feel free to call the clinic you have any questions or concerns. The clinic phone number is [phone].   I have been informed and understand all the instructions given to me. I know to contact the clinic, my physician, or go to the Emergency Department if any problems should occur. I do not have any questions at this time, but understand that I may call the clinic during office hours   should I have any questions or need assistance in obtaining follow up care.    __________________________________________  _____________  __________ Signature of Patient or Authorized Representative            Date                   Time    __________________________________________ Nurse's Signature    

## 2013-02-23 ENCOUNTER — Other Ambulatory Visit (HOSPITAL_BASED_OUTPATIENT_CLINIC_OR_DEPARTMENT_OTHER): Payer: Medicare Other | Admitting: Lab

## 2013-02-23 ENCOUNTER — Ambulatory Visit (HOSPITAL_BASED_OUTPATIENT_CLINIC_OR_DEPARTMENT_OTHER): Payer: Medicare Other | Admitting: Oncology

## 2013-02-23 VITALS — BP 139/53 | HR 40 | Temp 97.5°F | Resp 18 | Ht 71.0 in | Wt 172.6 lb

## 2013-02-23 DIAGNOSIS — C679 Malignant neoplasm of bladder, unspecified: Secondary | ICD-10-CM

## 2013-02-23 DIAGNOSIS — R112 Nausea with vomiting, unspecified: Secondary | ICD-10-CM

## 2013-02-23 DIAGNOSIS — C677 Malignant neoplasm of urachus: Secondary | ICD-10-CM

## 2013-02-23 LAB — COMPREHENSIVE METABOLIC PANEL (CC13)
Alkaline Phosphatase: 61 U/L (ref 40–150)
BUN: 30.5 mg/dL — ABNORMAL HIGH (ref 7.0–26.0)
Glucose: 109 mg/dl — ABNORMAL HIGH (ref 70–99)
Sodium: 142 mEq/L (ref 136–145)
Total Bilirubin: 0.31 mg/dL (ref 0.20–1.20)

## 2013-02-23 LAB — CBC WITH DIFFERENTIAL/PLATELET
Basophils Absolute: 0 10*3/uL (ref 0.0–0.1)
Eosinophils Absolute: 0.1 10*3/uL (ref 0.0–0.5)
LYMPH%: 20.9 % (ref 14.0–49.0)
MCV: 90.7 fL (ref 79.3–98.0)
MONO%: 16.8 % — ABNORMAL HIGH (ref 0.0–14.0)
NEUT#: 2.7 10*3/uL (ref 1.5–6.5)
Platelets: 149 10*3/uL (ref 140–400)
RBC: 3.43 10*6/uL — ABNORMAL LOW (ref 4.20–5.82)

## 2013-02-23 NOTE — Progress Notes (Signed)
Hematology and Oncology Follow Up Visit  Michael Ball [id number] [birth year] 77 y.o. 02/23/2013 12:23 PM [name],[name], MDNo ref. provider found   Principle Diagnosis: 77 year old with urothelial carcinoma of the bladder, high-grade, although it does not appear to be muscle invasive on the recent specimen.  Prior Therapy: He received 6 treatments of BCG that concluded in November of 2013. He had a repeat biopsy that was  done on 12/23/2012. The case number was [id number] which showed atypical urothelial cells suspicious for malignancy. That was the cytology report. The actual surgical pathology was done on 12/15/2012 and that was case number [id number] and showed urothelial carcinoma high-grade, papillary, noninvasive. The lamina propria is present but without invasion of tumor with question of lymph node involvement.    Current therapy: Neoadjuvant chemotherapy with Cisplatin and Gemzar started on 01/12/13. He is here for cycle 3 day 1.  Interim History:  [name] is seen for routine follow-up today prior to his next chemotherapy. He continues to tolerate it well. Using anti-emetics routinely, but states Compazine is making his feel anxious. He is not using Compazine routinely. Nausea is overall controlled, but feels queasy at times. No vomiting. Denies chest pain, SOB, DOE. No abdominal pain. States hematuria has now completely resolved. He has not other bleeding. He has mild fatigue, but is still able to perform most activity of daily living. He reports no new complications to cancer therapy.   Medications: I have reviewed the patient's current medications. Current outpatient prescriptions:amLODipine (NORVASC) 10 MG tablet, Take 5 mg by mouth daily. , Disp: , Rfl: ;  LORazepam (ATIVAN) 0.5 MG tablet, Take 1 tablet (0.5 mg total) by mouth every 8 (eight) hours., Disp: 30 tablet, Rfl: 1;  losartan-hydrochlorothiazide (HYZAAR) 100-25 MG per tablet, Take 1 tablet by mouth daily., Disp: , Rfl:   ondansetron (ZOFRAN) 8 MG tablet, Take 1 tablet (8 mg total) by mouth every 8 (eight) hours as needed for nausea., Disp: 20 tablet, Rfl: 2;  prochlorperazine (COMPAZINE) 10 MG tablet, Take 1 tablet (10 mg total) by mouth every 6 (six) hours as needed., Disp: 30 tablet, Rfl: 1;  rosuvastatin (CRESTOR) 20 MG tablet, Take 20 mg by mouth daily., Disp: , Rfl:  temazepam (RESTORIL) 30 MG capsule, Take 1 capsule by mouth at bedtime as needed., Disp: , Rfl: ;  atorvastatin (LIPITOR) 20 MG tablet, Take 20 mg by mouth daily., Disp: , Rfl:   Allergies: No Known Allergies  Past Medical History, Surgical history, Social history, and Family History were reviewed and updated.  Review of Systems: Constitutional:  Negative for fever, chills, night sweats, anorexia, weight loss, pain. Cardiovascular: no chest pain or dyspnea on exertion Respiratory: no cough, shortness of breath, or wheezing Neurological: no TIA or stroke symptoms Dermatological: negative ENT: negative Skin: Negative. Gastrointestinal: no abdominal pain, change in bowel habits, or black or bloody stools Genito-Urinary: no dysuria, trouble voiding, or hematuria Hematological and Lymphatic: negative Breast: negative for breast lumps Musculoskeletal: negative Remaining ROS negative.  Physical Exam: Blood pressure 139/53, pulse 40, temperature 97.5 F (36.4 C), temperature source Oral, resp. rate 18, height 5\' 11"  (1.803 m), weight 172 lb 9.6 oz (78.291 kg). ECOG: 1 General appearance: alert, cooperative and no distress Head: Normocephalic, without obvious abnormality, atraumatic Neck: no adenopathy, no carotid bruit, no JVD, supple, symmetrical, trachea midline and thyroid not enlarged, symmetric, no tenderness/mass/nodules Lymph nodes: Cervical, supraclavicular, and axillary nodes normal. Heart:regular rate and rhythm, S1, S2 normal, no murmur, click, rub or gallop Lung:chest  clear, no wheezing, rales, normal symmetric air entry, no  tachypnea, retractions or cyanosis Abdomen: soft, non-tender, without masses or organomegaly EXT:no erythema, induration, or nodules   Lab Results: Lab Results  Component Value Date   WBC 4.5 02/23/2013   HGB 10.7* 02/23/2013   HCT 31.1* 02/23/2013   MCV 90.7 02/23/2013   PLT 149 02/23/2013     Chemistry      Component Value Date/Time   NA 142 02/23/2013 1121   K 4.0 02/23/2013 1121   CL 106 02/23/2013 1121   CO2 26 02/23/2013 1121   BUN 30.5* 02/23/2013 1121   CREATININE 2.2* 02/23/2013 1121      Component Value Date/Time   CALCIUM 9.6 02/23/2013 1121   ALKPHOS 61 02/23/2013 1121   AST 16 02/23/2013 1121   ALT 11 02/23/2013 1121   BILITOT 0.31 02/23/2013 1121     Impression and Plan: This is a 77 year old male with the following issues: 1. Urothelial carcinoma of the bladder, high grade. Currently on neoadjuvant chemo with Cisplatin and Gemzar. He is here for cycle 3 day 1. Recommend that he proceed with chemotherapy on 4/2. His Creatinine is slightly up (GFR down slightly) I will dose reduce his Cisplatin by 25%. After the completion of his total of 3 cycles of chemotherapy, he has follow up set up with his Urologist at [facility] to discuss surgical options. He is interested of alternatives as well. I discussed with him the radiation option as well if he elects not to go with surgery. He will let me know after he meets with the surgeon.   2. Bradycardia. He is followed by Cardiology. 3. Nausea/vomiting prophylaxis. He has Zofran and Compazine at home. Compazine makes him feel anxious. Under control now. 4. Follow up in 1 weeks for cycle 3 day 8. After that, follow up in one month (around 5/14).      [facility] 4/1/201412:23 PM

## 2013-02-24 ENCOUNTER — Ambulatory Visit (HOSPITAL_BASED_OUTPATIENT_CLINIC_OR_DEPARTMENT_OTHER): Payer: Medicare Other

## 2013-02-24 ENCOUNTER — Telehealth: Payer: Self-pay | Admitting: *Deleted

## 2013-02-24 VITALS — BP 127/42 | HR 60 | Temp 97.8°F | Resp 18

## 2013-02-24 DIAGNOSIS — C677 Malignant neoplasm of urachus: Secondary | ICD-10-CM

## 2013-02-24 DIAGNOSIS — Z5111 Encounter for antineoplastic chemotherapy: Secondary | ICD-10-CM

## 2013-02-24 DIAGNOSIS — C679 Malignant neoplasm of bladder, unspecified: Secondary | ICD-10-CM

## 2013-02-24 MED ORDER — PALONOSETRON HCL INJECTION 0.25 MG/5ML
0.2500 mg | Freq: Once | INTRAVENOUS | Status: AC
  Administered 2013-02-24: 0.25 mg via INTRAVENOUS

## 2013-02-24 MED ORDER — FOSAPREPITANT DIMEGLUMINE INJECTION 150 MG
150.0000 mg | Freq: Once | INTRAVENOUS | Status: AC
  Administered 2013-02-24: 150 mg via INTRAVENOUS
  Filled 2013-02-24: qty 5

## 2013-02-24 MED ORDER — SODIUM CHLORIDE 0.9 % IV SOLN
50.0000 mg/m2 | Freq: Once | INTRAVENOUS | Status: AC
  Administered 2013-02-24: 100 mg via INTRAVENOUS
  Filled 2013-02-24: qty 100

## 2013-02-24 MED ORDER — SODIUM CHLORIDE 0.9 % IV SOLN
INTRAVENOUS | Status: DC
  Administered 2013-02-24: 10:00:00 via INTRAVENOUS

## 2013-02-24 MED ORDER — DEXAMETHASONE SODIUM PHOSPHATE 4 MG/ML IJ SOLN
12.0000 mg | Freq: Once | INTRAMUSCULAR | Status: AC
  Administered 2013-02-24: 12 mg via INTRAVENOUS

## 2013-02-24 MED ORDER — SODIUM CHLORIDE 0.9 % IV SOLN
1000.0000 mg/m2 | Freq: Once | INTRAVENOUS | Status: AC
  Administered 2013-02-24: 1976 mg via INTRAVENOUS
  Filled 2013-02-24: qty 52

## 2013-02-24 MED ORDER — POTASSIUM CHLORIDE 2 MEQ/ML IV SOLN
Freq: Once | INTRAVENOUS | Status: AC
  Administered 2013-02-24: 10:00:00 via INTRAVENOUS
  Filled 2013-02-24: qty 10

## 2013-02-24 NOTE — Patient Instructions (Addendum)
[facility] Discharge Instructions for Patients Receiving Chemotherapy  Today you received the following chemotherapy agents Cisplatin and Gemzar.  To help prevent nausea and vomiting after your treatment, we encourage you to take your nausea medication.   If you develop nausea and vomiting that is not controlled by your nausea medication, call the clinic. If it is after clinic hours your family physician or the after hours number for the clinic or go to the Emergency Department.   BELOW ARE SYMPTOMS THAT SHOULD BE REPORTED IMMEDIATELY:  *FEVER GREATER THAN 100.5 F  *CHILLS WITH OR WITHOUT FEVER  NAUSEA AND VOMITING THAT IS NOT CONTROLLED WITH YOUR NAUSEA MEDICATION  *UNUSUAL SHORTNESS OF BREATH  *UNUSUAL BRUISING OR BLEEDING  TENDERNESS IN MOUTH AND THROAT WITH OR WITHOUT PRESENCE OF ULCERS  *URINARY PROBLEMS  *BOWEL PROBLEMS  UNUSUAL RASH Items with * indicate a potential emergency and should be followed up as soon as possible.  One of the nurses will contact you 24 hours after your treatment. Please let the nurse know about any problems that you may have experienced. Feel free to call the clinic you have any questions or concerns. The clinic phone number is [phone].   I have been informed and understand all the instructions given to me. I know to contact the clinic, my physician, or go to the Emergency Department if any problems should occur. I do not have any questions at this time, but understand that I may call the clinic during office hours   should I have any questions or need assistance in obtaining follow up care.    __________________________________________  _____________  __________ Signature of Patient or Authorized Representative            Date                   Time    __________________________________________ Nurse's Signature

## 2013-02-24 NOTE — Telephone Encounter (Signed)
Lm gv appt d/t for 04/07/13. Also made pt aware that i will mail a letter/cal as well.

## 2013-03-02 ENCOUNTER — Encounter: Payer: Self-pay | Admitting: Oncology

## 2013-03-02 ENCOUNTER — Other Ambulatory Visit (HOSPITAL_BASED_OUTPATIENT_CLINIC_OR_DEPARTMENT_OTHER): Payer: Medicare Other | Admitting: Lab

## 2013-03-02 ENCOUNTER — Ambulatory Visit (HOSPITAL_BASED_OUTPATIENT_CLINIC_OR_DEPARTMENT_OTHER): Payer: Medicare Other | Admitting: Oncology

## 2013-03-02 ENCOUNTER — Ambulatory Visit (HOSPITAL_BASED_OUTPATIENT_CLINIC_OR_DEPARTMENT_OTHER): Payer: Medicare Other

## 2013-03-02 VITALS — BP 147/49 | HR 43 | Temp 97.0°F | Resp 18 | Ht 71.0 in | Wt 171.4 lb

## 2013-03-02 DIAGNOSIS — C677 Malignant neoplasm of urachus: Secondary | ICD-10-CM

## 2013-03-02 DIAGNOSIS — C679 Malignant neoplasm of bladder, unspecified: Secondary | ICD-10-CM

## 2013-03-02 DIAGNOSIS — Z5111 Encounter for antineoplastic chemotherapy: Secondary | ICD-10-CM

## 2013-03-02 LAB — COMPREHENSIVE METABOLIC PANEL (CC13)
Alkaline Phosphatase: 64 U/L (ref 40–150)
BUN: 25.6 mg/dL (ref 7.0–26.0)
Creatinine: 1.6 mg/dL — ABNORMAL HIGH (ref 0.7–1.3)
Glucose: 98 mg/dl (ref 70–99)
Sodium: 142 mEq/L (ref 136–145)
Total Bilirubin: 0.61 mg/dL (ref 0.20–1.20)

## 2013-03-02 LAB — CBC WITH DIFFERENTIAL/PLATELET
Basophils Absolute: 0 10*3/uL (ref 0.0–0.1)
EOS%: 1.7 % (ref 0.0–7.0)
HGB: 10.2 g/dL — ABNORMAL LOW (ref 13.0–17.1)
LYMPH%: 42.9 % (ref 14.0–49.0)
MCH: 30.2 pg (ref 27.2–33.4)
MCV: 90.2 fL (ref 79.3–98.0)
MONO%: 7.6 % (ref 0.0–14.0)
NEUT%: 47.1 % (ref 39.0–75.0)
RDW: 15.1 % — ABNORMAL HIGH (ref 11.0–14.6)

## 2013-03-02 MED ORDER — ONDANSETRON HCL 8 MG PO TABS: 8.0000 mg | ORAL_TABLET | Freq: Three times a day (TID) | ORAL | Status: DC | PRN

## 2013-03-02 MED ORDER — ONDANSETRON 8 MG/50ML IVPB (CHCC)
8.0000 mg | Freq: Once | INTRAVENOUS | Status: AC
  Administered 2013-03-02: 8 mg via INTRAVENOUS

## 2013-03-02 MED ORDER — SODIUM CHLORIDE 0.9 % IV SOLN
1000.0000 mg/m2 | Freq: Once | INTRAVENOUS | Status: AC
  Administered 2013-03-02: 1976 mg via INTRAVENOUS
  Filled 2013-03-02: qty 52

## 2013-03-02 MED ORDER — SODIUM CHLORIDE 0.9 % IV SOLN
Freq: Once | INTRAVENOUS | Status: AC
  Administered 2013-03-02: 10:00:00 via INTRAVENOUS

## 2013-03-02 MED ORDER — DEXAMETHASONE SODIUM PHOSPHATE 10 MG/ML IJ SOLN
10.0000 mg | Freq: Once | INTRAMUSCULAR | Status: AC
  Administered 2013-03-02: 10 mg via INTRAVENOUS

## 2013-03-02 NOTE — Progress Notes (Signed)
Hematology and Oncology Follow Up Visit  Michael Ball [id number] 02/10/1936 77 y.o. 03/02/2013 10:25 AM [name],[name], MDNo ref. provider found   Principle Diagnosis: 77 year old with urothelial carcinoma of the bladder, high-grade, although it does not appear to be muscle invasive on the recent specimen.  Prior Therapy: He received 6 treatments of BCG that concluded in November of 2013. He had a repeat biopsy that was  done on 12/23/2012. The case number was [id number] which showed atypical urothelial cells suspicious for malignancy. That was the cytology report. The actual surgical pathology was done on 12/15/2012 and that was case number [id number] and showed urothelial carcinoma high-grade, papillary, noninvasive. The lamina propria is present but without invasion of tumor with question of lymph node involvement.    Current therapy: Neoadjuvant chemotherapy with Cisplatin and Gemzar started on 01/12/13. He is here for cycle 3 day 8.  Interim History:  Michael Ball is seen for routine follow-up today prior to his next chemotherapy. He continues to tolerate it well. Using anti-emetics routinely, but states Compazine is making his feel anxious. He is not using Compazine routinely. Nausea is overall controlled, but feels queasy at times. No vomiting. Denies chest pain, SOB, DOE. No abdominal pain. States hematuria has now completely resolved. He has not other bleeding. He has mild fatigue, but is still able to perform most activity of daily living. He reports no new complications to cancer therapy.   Medications: I have reviewed the patient's current medications. Current outpatient prescriptions:amLODipine (NORVASC) 10 MG tablet, Take 5 mg by mouth daily. , Disp: , Rfl: ;  LORazepam (ATIVAN) 0.5 MG tablet, Take 1 tablet (0.5 mg total) by mouth every 8 (eight) hours., Disp: 30 tablet, Rfl: 1;  losartan-hydrochlorothiazide (HYZAAR) 100-25 MG per tablet, Take 1 tablet by mouth daily., Disp: , Rfl:   ondansetron (ZOFRAN) 8 MG tablet, Take 1 tablet (8 mg total) by mouth every 8 (eight) hours as needed for nausea., Disp: 20 tablet, Rfl: 2;  prochlorperazine (COMPAZINE) 10 MG tablet, Take 1 tablet (10 mg total) by mouth every 6 (six) hours as needed., Disp: 30 tablet, Rfl: 1;  rosuvastatin (CRESTOR) 20 MG tablet, Take 20 mg by mouth daily., Disp: , Rfl:  temazepam (RESTORIL) 30 MG capsule, Take 1 capsule by mouth at bedtime as needed., Disp: , Rfl:  No current facility-administered medications for this visit. Facility-Administered Medications Ordered in Other Visits: Gemcitabine HCl (GEMZAR) 1,976 mg in sodium chloride 0.9 % 100 mL chemo infusion, 1,000 mg/m2 (Treatment Plan Actual), Intravenous, Once, Benjiman Core, MD;  ondansetron [facility]) IVPB 8 mg, 8 mg, Intravenous, Once, Benjiman Core, MD, 8 mg at 03/02/13 1016  Allergies: No Known Allergies  Past Medical History, Surgical history, Social history, and Family History were reviewed and updated.  Review of Systems: Constitutional:  Negative for fever, chills, night sweats, anorexia, weight loss, pain. Cardiovascular: no chest pain or dyspnea on exertion Respiratory: no cough, shortness of breath, or wheezing Neurological: no TIA or stroke symptoms Dermatological: negative ENT: negative Skin: Negative. Gastrointestinal: no abdominal pain, change in bowel habits, or black or bloody stools Genito-Urinary: no dysuria, trouble voiding, or hematuria Hematological and Lymphatic: negative Breast: negative for breast lumps Musculoskeletal: negative Remaining ROS negative.  Physical Exam: Blood pressure 147/49, pulse 43, temperature 97 F (36.1 C), temperature source Oral, resp. rate 18, height 5\' 11"  (1.803 m), weight 171 lb 6.4 oz (77.747 kg). ECOG: 1 General appearance: alert, cooperative and no distress Head: Normocephalic, without obvious abnormality, atraumatic  Neck: no adenopathy, no carotid bruit, no JVD, supple, symmetrical,  trachea midline and thyroid not enlarged, symmetric, no tenderness/mass/nodules Lymph nodes: Cervical, supraclavicular, and axillary nodes normal. Heart:regular rate and rhythm, S1, S2 normal, no murmur, click, rub or gallop Lung:chest clear, no wheezing, rales, normal symmetric air entry, no tachypnea, retractions or cyanosis Abdomen: soft, non-tender, without masses or organomegaly EXT:no erythema, induration, or nodules   Lab Results: Lab Results  Component Value Date   WBC 3.0* 03/02/2013   HGB 10.2* 03/02/2013   HCT 30.5* 03/02/2013   MCV 90.2 03/02/2013   PLT 194 03/02/2013     Chemistry      Component Value Date/Time   NA 142 02/23/2013 1121   K 4.0 02/23/2013 1121   CL 106 02/23/2013 1121   CO2 26 02/23/2013 1121   BUN 30.5* 02/23/2013 1121   CREATININE 2.2* 02/23/2013 1121      Component Value Date/Time   CALCIUM 9.6 02/23/2013 1121   ALKPHOS 61 02/23/2013 1121   AST 16 02/23/2013 1121   ALT 11 02/23/2013 1121   BILITOT 0.31 02/23/2013 1121     Impression and Plan: This is a 77 year old male with the following issues: 1. Urothelial carcinoma of the bladder, high grade. Currently on neoadjuvant chemo with Cisplatin and Gemzar. He is here for cycle 3 day 8. Recommend that he proceed with chemotherapy today without further dose modification. After the completion of his total of 3 cycles of chemotherapy, he has follow up set up with his Urologist at [facility] to discuss surgical options; appointment is scheduled for 03/08/13.   2. Bradycardia. He is followed by Cardiology. 3. Nausea/vomiting prophylaxis. He has Zofran. Compazine, and Ativan at home. Compazine makes him feel anxious. Under control now. 4. Follow up on 04/07/13.     [place], [place] 4/8/201410:25 AM

## 2013-03-09 ENCOUNTER — Other Ambulatory Visit: Payer: Self-pay | Admitting: Oncology

## 2013-03-09 DIAGNOSIS — C679 Malignant neoplasm of bladder, unspecified: Secondary | ICD-10-CM

## 2013-03-09 NOTE — Progress Notes (Signed)
Case was discussed with Dr. Max Fickle and he recommended 3 more cycles of Gemzar-Cisplatin chemotherapy. I will arrange for him to get started on 4/22 (lab and visit with APP on 4/21 before hand).

## 2013-03-10 ENCOUNTER — Telehealth: Payer: Self-pay | Admitting: *Deleted

## 2013-03-10 NOTE — Telephone Encounter (Signed)
Per staff message and POF I have scheduled appts. Appt for 5/13 to see APP is to late to start 7 txt, message sent back to scheduler to advise new appt.  JW

## 2013-03-11 ENCOUNTER — Telehealth: Payer: Self-pay | Admitting: *Deleted

## 2013-03-11 ENCOUNTER — Telehealth: Payer: Self-pay | Admitting: Oncology

## 2013-03-11 NOTE — Telephone Encounter (Signed)
Appt made for 5/13  JMW

## 2013-03-15 ENCOUNTER — Other Ambulatory Visit: Payer: Medicare Other | Admitting: Lab

## 2013-03-15 ENCOUNTER — Ambulatory Visit: Payer: Medicare Other | Admitting: Oncology

## 2013-03-16 ENCOUNTER — Other Ambulatory Visit: Payer: Self-pay | Admitting: Oncology

## 2013-03-16 ENCOUNTER — Ambulatory Visit: Payer: Medicare Other | Admitting: Lab

## 2013-03-16 ENCOUNTER — Ambulatory Visit: Payer: Medicare Other

## 2013-03-23 ENCOUNTER — Ambulatory Visit (HOSPITAL_BASED_OUTPATIENT_CLINIC_OR_DEPARTMENT_OTHER): Payer: Medicare Other | Admitting: Oncology

## 2013-03-23 ENCOUNTER — Telehealth: Payer: Self-pay | Admitting: Oncology

## 2013-03-23 ENCOUNTER — Other Ambulatory Visit: Payer: Self-pay | Admitting: Oncology

## 2013-03-23 ENCOUNTER — Other Ambulatory Visit (HOSPITAL_BASED_OUTPATIENT_CLINIC_OR_DEPARTMENT_OTHER): Payer: Medicare Other | Admitting: Lab

## 2013-03-23 ENCOUNTER — Ambulatory Visit (HOSPITAL_BASED_OUTPATIENT_CLINIC_OR_DEPARTMENT_OTHER): Payer: Medicare Other

## 2013-03-23 VITALS — BP 140/58 | HR 38 | Temp 98.1°F | Resp 18

## 2013-03-23 VITALS — BP 126/46 | HR 42 | Temp 98.0°F | Resp 20 | Ht 71.0 in | Wt 175.0 lb

## 2013-03-23 DIAGNOSIS — C677 Malignant neoplasm of urachus: Secondary | ICD-10-CM

## 2013-03-23 DIAGNOSIS — C679 Malignant neoplasm of bladder, unspecified: Secondary | ICD-10-CM

## 2013-03-23 DIAGNOSIS — Z5111 Encounter for antineoplastic chemotherapy: Secondary | ICD-10-CM

## 2013-03-23 LAB — CBC WITH DIFFERENTIAL/PLATELET
BASO%: 0.7 % (ref 0.0–2.0)
EOS%: 1.6 % (ref 0.0–7.0)
HCT: 31.1 % — ABNORMAL LOW (ref 38.4–49.9)
HGB: 10.1 g/dL — ABNORMAL LOW (ref 13.0–17.1)
MCH: 30.7 pg (ref 27.2–33.4)
MCHC: 32.5 g/dL (ref 32.0–36.0)
MONO#: 0.9 10*3/uL (ref 0.1–0.9)
RDW: 17.6 % — ABNORMAL HIGH (ref 11.0–14.6)
WBC: 5.6 10*3/uL (ref 4.0–10.3)
lymph#: 1.8 10*3/uL (ref 0.9–3.3)

## 2013-03-23 LAB — COMPREHENSIVE METABOLIC PANEL (CC13)
ALT: 11 U/L (ref 0–55)
Alkaline Phosphatase: 69 U/L (ref 40–150)
CO2: 26 mEq/L (ref 22–29)
Sodium: 144 mEq/L (ref 136–145)
Total Bilirubin: 0.4 mg/dL (ref 0.20–1.20)
Total Protein: 7.6 g/dL (ref 6.4–8.3)

## 2013-03-23 MED ORDER — DEXAMETHASONE SODIUM PHOSPHATE 4 MG/ML IJ SOLN
12.0000 mg | Freq: Once | INTRAMUSCULAR | Status: AC
  Administered 2013-03-23: 12 mg via INTRAVENOUS

## 2013-03-23 MED ORDER — SODIUM CHLORIDE 0.9 % IV SOLN
INTRAVENOUS | Status: DC
  Administered 2013-03-23: 10:00:00 via INTRAVENOUS

## 2013-03-23 MED ORDER — SODIUM CHLORIDE 0.9 % IV SOLN
50.0000 mg/m2 | Freq: Once | INTRAVENOUS | Status: AC
  Administered 2013-03-23: 100 mg via INTRAVENOUS
  Filled 2013-03-23: qty 100

## 2013-03-23 MED ORDER — SODIUM CHLORIDE 0.9 % IV SOLN
1000.0000 mg/m2 | Freq: Once | INTRAVENOUS | Status: AC
  Administered 2013-03-23: 1976 mg via INTRAVENOUS
  Filled 2013-03-23: qty 52

## 2013-03-23 MED ORDER — POTASSIUM CHLORIDE 2 MEQ/ML IV SOLN
Freq: Once | INTRAVENOUS | Status: AC
  Administered 2013-03-23: 10:00:00 via INTRAVENOUS
  Filled 2013-03-23: qty 10

## 2013-03-23 MED ORDER — PALONOSETRON HCL INJECTION 0.25 MG/5ML
0.2500 mg | Freq: Once | INTRAVENOUS | Status: AC
  Administered 2013-03-23: 0.25 mg via INTRAVENOUS

## 2013-03-23 MED ORDER — SODIUM CHLORIDE 0.9 % IV SOLN
150.0000 mg | Freq: Once | INTRAVENOUS | Status: AC
  Administered 2013-03-23: 150 mg via INTRAVENOUS
  Filled 2013-03-23: qty 5

## 2013-03-23 NOTE — Patient Instructions (Addendum)
Cisplatin injection What is this medicine? CISPLATIN (SIS pla tin) is a chemotherapy drug. It targets fast dividing cells, like cancer cells, and causes these cells to die. This medicine is used to treat many types of cancer like bladder, ovarian, and testicular cancers. This medicine may be used for other purposes; ask your health care provider or pharmacist if you have questions. What should I tell my health care provider before I take this medicine? They need to know if you have any of these conditions: -blood disorders -hearing problems -kidney disease -recent or ongoing radiation therapy -an unusual or allergic reaction to cisplatin, carboplatin, other chemotherapy, other medicines, foods, dyes, or preservatives -pregnant or trying to get pregnant -breast-feeding How should I use this medicine? This drug is given as an infusion into a vein. It is administered in a hospital or clinic by a specially trained health care professional. Talk to your pediatrician regarding the use of this medicine in children. Special care may be needed. Overdosage: If you think you have taken too much of this medicine contact a poison control center or emergency room at once. NOTE: This medicine is only for you. Do not share this medicine with others. What if I miss a dose? It is important not to miss a dose. Call your doctor or health care professional if you are unable to keep an appointment. What may interact with this medicine? -dofetilide -foscarnet -medicines for seizures -medicines to increase blood counts like filgrastim, pegfilgrastim, sargramostim -probenecid -pyridoxine used with altretamine -rituximab -some antibiotics like amikacin, gentamicin, neomycin, polymyxin B, streptomycin, tobramycin -sulfinpyrazone -vaccines -zalcitabine Talk to your doctor or health care professional before taking any of these medicines: -acetaminophen -aspirin -ibuprofen -ketoprofen -naproxen This list may  not describe all possible interactions. Give your health care provider a list of all the medicines, herbs, non-prescription drugs, or dietary supplements you use. Also tell them if you smoke, drink alcohol, or use illegal drugs. Some items may interact with your medicine. What should I watch for while using this medicine? Your condition will be monitored carefully while you are receiving this medicine. You will need important blood work done while you are taking this medicine. This drug may make you feel generally unwell. This is not uncommon, as chemotherapy can affect healthy cells as well as cancer cells. Report any side effects. Continue your course of treatment even though you feel ill unless your doctor tells you to stop. In some cases, you may be given additional medicines to help with side effects. Follow all directions for their use. Call your doctor or health care professional for advice if you get a fever, chills or sore throat, or other symptoms of a cold or flu. Do not treat yourself. This drug decreases your body's ability to fight infections. Try to avoid being around people who are sick. This medicine may increase your risk to bruise or bleed. Call your doctor or health care professional if you notice any unusual bleeding. Be careful brushing and flossing your teeth or using a toothpick because you may get an infection or bleed more easily. If you have any dental work done, tell your dentist you are receiving this medicine. Avoid taking products that contain aspirin, acetaminophen, ibuprofen, naproxen, or ketoprofen unless instructed by your doctor. These medicines may hide a fever. Do not become pregnant while taking this medicine. Women should inform their doctor if they wish to become pregnant or think they might be pregnant. There is a potential for serious side effects to   an unborn child. Talk to your health care professional or pharmacist for more information. Do not breast-feed an  infant while taking this medicine. Drink fluids as directed while you are taking this medicine. This will help protect your kidneys. Call your doctor or health care professional if you get diarrhea. Do not treat yourself. What side effects may I notice from receiving this medicine? Side effects that you should report to your doctor or health care professional as soon as possible: -allergic reactions like skin rash, itching or hives, swelling of the face, lips, or tongue -signs of infection - fever or chills, cough, sore throat, pain or difficulty passing urine -signs of decreased platelets or bleeding - bruising, pinpoint red spots on the skin, black, tarry stools, nosebleeds -signs of decreased red blood cells - unusually weak or tired, fainting spells, lightheadedness -breathing problems -changes in hearing -gout pain -low blood counts - This drug may decrease the number of white blood cells, red blood cells and platelets. You may be at increased risk for infections and bleeding. -nausea and vomiting -pain, swelling, redness or irritation at the injection site -pain, tingling, numbness in the hands or feet -problems with balance, movement -trouble passing urine or change in the amount of urine Side effects that usually do not require medical attention (report to your doctor or health care professional if they continue or are bothersome): -changes in vision -loss of appetite -metallic taste in the mouth or changes in taste This list may not describe all possible side effects. Call your doctor for medical advice about side effects. You may report side effects to FDA at 1-800-FDA-1088. Where should I keep my medicine? This drug is given in a hospital or clinic and will not be stored at home. NOTE: This sheet is a summary. It may not cover all possible information. If you have questions about this medicine, talk to your doctor, pharmacist, or health care provider.  2012, [employer]/Gold  Standard. (02/16/2008 2:40:54 PM)Gemcitabine injection What is this medicine? GEMCITABINE (jem SIT a been) is a chemotherapy drug. This medicine is used to treat many types of cancer like breast cancer, lung cancer, pancreatic cancer, and ovarian cancer. This medicine may be used for other purposes; ask your health care provider or pharmacist if you have questions. What should I tell my health care provider before I take this medicine? They need to know if you have any of these conditions: -blood disorders -infection -kidney disease -liver disease -recent or ongoing radiation therapy -an unusual or allergic reaction to gemcitabine, other chemotherapy, other medicines, foods, dyes, or preservatives -pregnant or trying to get pregnant -breast-feeding How should I use this medicine? This drug is given as an infusion into a vein. It is administered in a hospital or clinic by a specially trained health care professional. Talk to your pediatrician regarding the use of this medicine in children. Special care may be needed. Overdosage: If you think you have taken too much of this medicine contact a poison control center or emergency room at once. NOTE: This medicine is only for you. Do not share this medicine with others. What if I miss a dose? It is important not to miss your dose. Call your doctor or health care professional if you are unable to keep an appointment. What may interact with this medicine? -medicines to increase blood counts like filgrastim, pegfilgrastim, sargramostim -some other chemotherapy drugs like cisplatin -vaccines Talk to your doctor or health care professional before taking any of these medicines: -acetaminophen -aspirin -  ibuprofen -ketoprofen -naproxen This list may not describe all possible interactions. Give your health care provider a list of all the medicines, herbs, non-prescription drugs, or dietary supplements you use. Also tell them if you smoke, drink  alcohol, or use illegal drugs. Some items may interact with your medicine. What should I watch for while using this medicine? Visit your doctor for checks on your progress. This drug may make you feel generally unwell. This is not uncommon, as chemotherapy can affect healthy cells as well as cancer cells. Report any side effects. Continue your course of treatment even though you feel ill unless your doctor tells you to stop. In some cases, you may be given additional medicines to help with side effects. Follow all directions for their use. Call your doctor or health care professional for advice if you get a fever, chills or sore throat, or other symptoms of a cold or flu. Do not treat yourself. This drug decreases your body's ability to fight infections. Try to avoid being around people who are sick. This medicine may increase your risk to bruise or bleed. Call your doctor or health care professional if you notice any unusual bleeding. Be careful brushing and flossing your teeth or using a toothpick because you may get an infection or bleed more easily. If you have any dental work done, tell your dentist you are receiving this medicine. Avoid taking products that contain aspirin, acetaminophen, ibuprofen, naproxen, or ketoprofen unless instructed by your doctor. These medicines may hide a fever. Women should inform their doctor if they wish to become pregnant or think they might be pregnant. There is a potential for serious side effects to an unborn child. Talk to your health care professional or pharmacist for more information. Do not breast-feed an infant while taking this medicine. What side effects may I notice from receiving this medicine? Side effects that you should report to your doctor or health care professional as soon as possible: -allergic reactions like skin rash, itching or hives, swelling of the face, lips, or tongue -low blood counts - this medicine may decrease the number of white blood  cells, red blood cells and platelets. You may be at increased risk for infections and bleeding. -signs of infection - fever or chills, cough, sore throat, pain or difficulty passing urine -signs of decreased platelets or bleeding - bruising, pinpoint red spots on the skin, black, tarry stools, blood in the urine -signs of decreased red blood cells - unusually weak or tired, fainting spells, lightheadedness -breathing problems -chest pain -mouth sores -nausea and vomiting -pain, swelling, redness at site where injected -pain, tingling, numbness in the hands or feet -stomach pain -swelling of ankles, feet, hands -unusual bleeding Side effects that usually do not require medical attention (report to your doctor or health care professional if they continue or are bothersome): -constipation -diarrhea -hair loss -loss of appetite -stomach upset This list may not describe all possible side effects. Call your doctor for medical advice about side effects. You may report side effects to FDA at 1-800-FDA-1088. Where should I keep my medicine? This drug is given in a hospital or clinic and will not be stored at home. NOTE: This sheet is a summary. It may not cover all possible information. If you have questions about this medicine, talk to your doctor, pharmacist, or health care provider.  2013, [employer]/Gold Standard. (03/22/2008 6:45:54 PM)

## 2013-03-23 NOTE — Progress Notes (Signed)
Ok to tx with pulse rate 38 pr dr Clelia Croft. dmr

## 2013-03-23 NOTE — Progress Notes (Signed)
Hematology and Oncology Follow Up Visit  Michael Ball [id number] 27-Jul-1936 77 y.o. 03/23/2013 8:42 AM [name],[name], [name], [name], MD   Principle Diagnosis: 77 year old with urothelial carcinoma of the bladder, high-grade, although it does not appear to be muscle invasive on the recent specimen.  Prior Therapy: He received 6 treatments of BCG that concluded in November of 2013. He had a repeat biopsy that was  done on 12/23/2012. The case number was [id number] which showed atypical urothelial cells suspicious for malignancy. That was the cytology report. The actual surgical pathology was done on 12/15/2012 and that was case number [id number] and showed urothelial carcinoma high-grade, papillary, noninvasive. The lamina propria is present but without invasion of tumor with question of lymph node involvement.    Current therapy: Neoadjuvant chemotherapy with Cisplatin and Gemzar started on 01/12/13. He is here for cycle 4 day 1.  Interim History:  Michael Ball is seen for routine follow-up today prior to his next chemotherapy. He continues to tolerate it well. Using anti-emetics routinely, but states Compazine is making his feel anxious. He is not using Compazine routinely. Nausea is overall controlled, but feels queasy at times. No vomiting. Denies chest pain, SOB, DOE. No abdominal pain. States hematuria has now completely resolved. He has not other bleeding. He has mild fatigue, but is still able to perform most activity of daily living. He reports no new complications to cancer therapy. He was evaluated after three cycles at [facility] and it was recommended he proceed with three more treatments. His CT scan three cycles showed a partial response.   Medications: I have reviewed the patient's current medications. Current outpatient prescriptions:amLODipine (NORVASC) 10 MG tablet, Take 5 mg by mouth daily. , Disp: , Rfl: ;  LORazepam (ATIVAN) 0.5 MG tablet, Take 1 tablet (0.5 mg total) by mouth every 8  (eight) hours., Disp: 30 tablet, Rfl: 1;  losartan-hydrochlorothiazide (HYZAAR) 100-25 MG per tablet, Take 1 tablet by mouth daily., Disp: , Rfl:  ondansetron (ZOFRAN) 8 MG tablet, Take 1 tablet (8 mg total) by mouth every 8 (eight) hours as needed for nausea., Disp: 20 tablet, Rfl: 2;  prochlorperazine (COMPAZINE) 10 MG tablet, Take 1 tablet (10 mg total) by mouth every 6 (six) hours as needed., Disp: 30 tablet, Rfl: 1;  rosuvastatin (CRESTOR) 20 MG tablet, Take 20 mg by mouth daily., Disp: , Rfl:  temazepam (RESTORIL) 30 MG capsule, Take 1 capsule by mouth at bedtime as needed., Disp: , Rfl:   Allergies: No Known Allergies  Past Medical History, Surgical history, Social history, and Family History were reviewed and updated.  Review of Systems: Constitutional:  Negative for fever, chills, night sweats, anorexia, weight loss, pain. Cardiovascular: no chest pain or dyspnea on exertion Respiratory: no cough, shortness of breath, or wheezing Neurological: no TIA or stroke symptoms Dermatological: negative ENT: negative Skin: Negative. Gastrointestinal: no abdominal pain, change in bowel habits, or black or bloody stools Genito-Urinary: no dysuria, trouble voiding, or hematuria Hematological and Lymphatic: negative Breast: negative for breast lumps Musculoskeletal: negative Remaining ROS negative.  Physical Exam: Blood pressure 126/46, pulse 42, temperature 98 F (36.7 C), temperature source Oral, resp. rate 20, height 5\' 11"  (1.803 m), weight 175 lb (79.379 kg). ECOG: 1 General appearance: alert, cooperative and no distress Head: Normocephalic, without obvious abnormality, atraumatic Neck: no adenopathy, no carotid bruit, no JVD, supple, symmetrical, trachea midline and thyroid not enlarged, symmetric, no tenderness/mass/nodules Lymph nodes: Cervical, supraclavicular, and axillary nodes normal. Heart:regular rate and rhythm, S1,  S2 normal, no murmur, click, rub or gallop Lung:chest  clear, no wheezing, rales, normal symmetric air entry, no tachypnea, retractions or cyanosis Abdomen: soft, non-tender, without masses or organomegaly EXT:no erythema, induration, or nodules   Lab Results: Lab Results  Component Value Date   WBC 5.6 03/23/2013   HGB 10.1* 03/23/2013   HCT 31.1* 03/23/2013   MCV 94.5 03/23/2013   PLT 293 03/23/2013     Chemistry      Component Value Date/Time   NA 142 03/02/2013 0839   K 4.1 03/02/2013 0839   CL 103 03/02/2013 0839   CO2 26 03/02/2013 0839   BUN 25.6 03/02/2013 0839   CREATININE 1.6* 03/02/2013 0839      Component Value Date/Time   CALCIUM 9.8 03/02/2013 0839   ALKPHOS 64 03/02/2013 0839   AST 27 03/02/2013 0839   ALT 18 03/02/2013 0839   BILITOT 0.61 03/02/2013 0839     Impression and Plan: This is a 77 year old male with the following issues: 1. Urothelial carcinoma of the bladder, high grade. Currently on neoadjuvant chemo with Cisplatin and Gemzar. He is here for cycle 4 day 1. Recommend that he proceed with chemotherapy today without further dose modification. Plan for a total of 6 cycles.    2. Bradycardia. He is followed by Cardiology. Asymptomatic at this time.  3. Nausea/vomiting prophylaxis. He has Zofran. Compazine, and Ativan at home. Compazine makes him feel anxious. Under control now. 4. Follow up on 5/6 for day 8 cycle 4.     Michael Ball 4/29/20148:42 AM

## 2013-03-23 NOTE — Telephone Encounter (Signed)
appts complete. gv pt appt schedule while in inf.

## 2013-03-30 ENCOUNTER — Other Ambulatory Visit (HOSPITAL_BASED_OUTPATIENT_CLINIC_OR_DEPARTMENT_OTHER): Payer: Medicare Other | Admitting: Lab

## 2013-03-30 ENCOUNTER — Ambulatory Visit (HOSPITAL_BASED_OUTPATIENT_CLINIC_OR_DEPARTMENT_OTHER): Payer: Medicare Other | Admitting: Oncology

## 2013-03-30 ENCOUNTER — Ambulatory Visit (HOSPITAL_BASED_OUTPATIENT_CLINIC_OR_DEPARTMENT_OTHER): Payer: Medicare Other

## 2013-03-30 ENCOUNTER — Encounter: Payer: Self-pay | Admitting: Oncology

## 2013-03-30 VITALS — BP 149/44 | HR 73 | Temp 97.3°F | Resp 18 | Ht 71.0 in | Wt 174.0 lb

## 2013-03-30 DIAGNOSIS — Z5111 Encounter for antineoplastic chemotherapy: Secondary | ICD-10-CM

## 2013-03-30 DIAGNOSIS — C679 Malignant neoplasm of bladder, unspecified: Secondary | ICD-10-CM

## 2013-03-30 LAB — COMPREHENSIVE METABOLIC PANEL (CC13)
ALT: 17 U/L (ref 0–55)
AST: 24 U/L (ref 5–34)
Alkaline Phosphatase: 71 U/L (ref 40–150)
CO2: 22 mEq/L (ref 22–29)
Creatinine: 1.7 mg/dL — ABNORMAL HIGH (ref 0.7–1.3)
Sodium: 141 mEq/L (ref 136–145)
Total Bilirubin: 0.49 mg/dL (ref 0.20–1.20)
Total Protein: 7.4 g/dL (ref 6.4–8.3)

## 2013-03-30 LAB — CBC WITH DIFFERENTIAL/PLATELET
BASO%: 1.5 % (ref 0.0–2.0)
EOS%: 0.8 % (ref 0.0–7.0)
LYMPH%: 25.4 % (ref 14.0–49.0)
MCH: 31.2 pg (ref 27.2–33.4)
MCHC: 33.7 g/dL (ref 32.0–36.0)
MCV: 92.6 fL (ref 79.3–98.0)
MONO%: 10.3 % (ref 0.0–14.0)
NEUT#: 2.5 10*3/uL (ref 1.5–6.5)
Platelets: 182 10*3/uL (ref 140–400)
RBC: 3.11 10*6/uL — ABNORMAL LOW (ref 4.20–5.82)
RDW: 16.3 % — ABNORMAL HIGH (ref 11.0–14.6)
nRBC: 0 % (ref 0–0)

## 2013-03-30 MED ORDER — ONDANSETRON 8 MG/50ML IVPB (CHCC)
8.0000 mg | Freq: Once | INTRAVENOUS | Status: AC
  Administered 2013-03-30: 8 mg via INTRAVENOUS

## 2013-03-30 MED ORDER — ONDANSETRON HCL 8 MG PO TABS: 8.0000 mg | ORAL_TABLET | Freq: Three times a day (TID) | ORAL | Status: DC | PRN

## 2013-03-30 MED ORDER — SODIUM CHLORIDE 0.9 % IV SOLN
1000.0000 mg/m2 | Freq: Once | INTRAVENOUS | Status: AC
  Administered 2013-03-30: 1976 mg via INTRAVENOUS
  Filled 2013-03-30: qty 52

## 2013-03-30 MED ORDER — DEXAMETHASONE SODIUM PHOSPHATE 10 MG/ML IJ SOLN
10.0000 mg | Freq: Once | INTRAMUSCULAR | Status: AC
  Administered 2013-03-30: 10 mg via INTRAVENOUS

## 2013-03-30 MED ORDER — SODIUM CHLORIDE 0.9 % IV SOLN
Freq: Once | INTRAVENOUS | Status: AC
  Administered 2013-03-30: 14:00:00 via INTRAVENOUS

## 2013-03-30 NOTE — Progress Notes (Signed)
Hematology and Oncology Follow Up Visit  Michael Ball [id number] February 25, 1936 77 y.o. 03/30/2013 1:46 PM [name],[name], [name], [name], MD   Principle Diagnosis: 77 year old with urothelial carcinoma of the bladder, high-grade, although it does not appear to be muscle invasive on the recent specimen.  Prior Therapy: He received 6 treatments of BCG that concluded in November of 2013. He had a repeat biopsy that was  done on 12/23/2012. The case number was [id number] which showed atypical urothelial cells suspicious for malignancy. That was the cytology report. The actual surgical pathology was done on 12/15/2012 and that was case number [id number] and showed urothelial carcinoma high-grade, papillary, noninvasive. The lamina propria is present but without invasion of tumor with question of lymph node involvement.   Current therapy: Neoadjuvant chemotherapy with Cisplatin and Gemzar started on 01/12/13. He is here for cycle 4 day 8.  Interim History:  Michael Ball is seen for routine follow-up today prior to his next chemotherapy. He continues to tolerate it well. Using anti-emetics routinely, but states Compazine is making his feel anxious. He is not using Compazine routinely. Nausea is overall controlled, but feels queasy at times. No vomiting. Denies chest pain, SOB, DOE. No abdominal pain. States hematuria has now completely resolved. He has not other bleeding. He has mild fatigue, but is still able to perform most activity of daily living. He reports no new complications to cancer therapy. He was evaluated after three cycles at [facility] and it was recommended he proceed with three more treatments. His CT scan three cycles showed a partial response.   Medications: I have reviewed the patient's current medications. Current outpatient prescriptions:amLODipine (NORVASC) 10 MG tablet, Take 5 mg by mouth daily. , Disp: , Rfl: ;  LORazepam (ATIVAN) 0.5 MG tablet, Take 1 tablet (0.5 mg total) by mouth every 8  (eight) hours., Disp: 30 tablet, Rfl: 1;  ondansetron (ZOFRAN) 8 MG tablet, Take 1 tablet (8 mg total) by mouth every 8 (eight) hours as needed for nausea., Disp: 20 tablet, Rfl: 2 prochlorperazine (COMPAZINE) 10 MG tablet, Take 1 tablet (10 mg total) by mouth every 6 (six) hours as needed., Disp: 30 tablet, Rfl: 1;  rosuvastatin (CRESTOR) 20 MG tablet, Take 20 mg by mouth daily., Disp: , Rfl: ;  temazepam (RESTORIL) 30 MG capsule, Take 1 capsule by mouth at bedtime as needed., Disp: , Rfl:   Allergies: No Known Allergies  Past Medical History, Surgical history, Social history, and Family History were reviewed and updated.  Review of Systems: Constitutional:  Negative for fever, chills, night sweats, anorexia, weight loss, pain. Cardiovascular: no chest pain or dyspnea on exertion Respiratory: no cough, shortness of breath, or wheezing Neurological: no TIA or stroke symptoms Dermatological: negative ENT: negative Skin: Negative. Gastrointestinal: no abdominal pain, change in bowel habits, or black or bloody stools Genito-Urinary: no dysuria, trouble voiding, or hematuria Hematological and Lymphatic: negative Breast: negative for breast lumps Musculoskeletal: negative Remaining ROS negative.  Physical Exam: Blood pressure 149/44, pulse 73, temperature 97.3 F (36.3 C), temperature source Oral, resp. rate 18, height 5\' 11"  (1.803 m), weight 174 lb (78.926 kg), SpO2 97.00%. ECOG: 1 General appearance: alert, cooperative and no distress Head: Normocephalic, without obvious abnormality, atraumatic Neck: no adenopathy, no carotid bruit, no JVD, supple, symmetrical, trachea midline and thyroid not enlarged, symmetric, no tenderness/mass/nodules Lymph nodes: Cervical, supraclavicular, and axillary nodes normal. Heart:regular rate and rhythm, S1, S2 normal, no murmur, click, rub or gallop Lung:chest clear, no wheezing, rales, normal symmetric  air entry, no tachypnea, retractions or  cyanosis Abdomen: soft, non-tender, without masses or organomegaly EXT:no erythema, induration, or nodules   Lab Results: Lab Results  Component Value Date   WBC 4.0 03/30/2013   HGB 9.7* 03/30/2013   HCT 28.8* 03/30/2013   MCV 92.6 03/30/2013   PLT 182 03/30/2013     Chemistry      Component Value Date/Time   NA 144 03/23/2013 0749   K 4.2 03/23/2013 0749   CL 107 03/23/2013 0749   CO2 26 03/23/2013 0749   BUN 26.6* 03/23/2013 0749   CREATININE 1.7* 03/23/2013 0749      Component Value Date/Time   CALCIUM 10.0 03/23/2013 0749   ALKPHOS 69 03/23/2013 0749   AST 21 03/23/2013 0749   ALT 11 03/23/2013 0749   BILITOT 0.40 03/23/2013 0749     Impression and Plan: This is a 77 year old male with the following issues: 1. Urothelial carcinoma of the bladder, high grade. Currently on neoadjuvant chemo with Cisplatin and Gemzar. He is here for cycle 4 day 8. Recommend that he proceed with chemotherapy today without further dose modification. Plan for a total of 6 cycles.    2. Bradycardia. He is followed by Cardiology. Asymptomatic at this time.  3. Nausea/vomiting prophylaxis. He has Zofran. Compazine, and Ativan at home. Compazine makes him feel anxious. Under control now. 4. Follow up in 2 weeks for cycle 5 day 1.     [place], [place] 5/6/20141:46 PM

## 2013-03-30 NOTE — Progress Notes (Signed)
Discharged at 1520 with spouse.  Ambulating well in no distress.

## 2013-03-30 NOTE — Patient Instructions (Signed)
[facility] Discharge Instructions for Patients Receiving Chemotherapy  Today you received the following chemotherapy agents Gemzar  To help prevent nausea and vomiting after your treatment, we encourage you to take your nausea medication prochlorperazine 10 mg, ondansetron 8 mg. Begin taking it at anytime upon discharge and take it as often as prescribed by Dr. Clelia Croft for the next 72 hours and as needed.   If you develop nausea and vomiting that is not controlled by your nausea medication, call the clinic. If it is after clinic hours your family physician or the after hours number for the clinic or go to the Emergency Department.   BELOW ARE SYMPTOMS THAT SHOULD BE REPORTED IMMEDIATELY:  *FEVER GREATER THAN 100.5 F  *CHILLS WITH OR WITHOUT FEVER  NAUSEA AND VOMITING THAT IS NOT CONTROLLED WITH YOUR NAUSEA MEDICATION  *UNUSUAL SHORTNESS OF BREATH  *UNUSUAL BRUISING OR BLEEDING  TENDERNESS IN MOUTH AND THROAT WITH OR WITHOUT PRESENCE OF ULCERS  *URINARY PROBLEMS  *BOWEL PROBLEMS  UNUSUAL RASH Items with * indicate a potential emergency and should be followed up as soon as possible.  Please call to let a nurse know about any problems that you may have experienced. Feel free to call the clinic you have any questions or concerns. The clinic phone number is [phone].   I have been informed and understand all the instructions given to me. I know to contact the clinic, my physician, or go to the Emergency Department if any problems should occur. I do not have any questions at this time, but understand that I may call the clinic during office hours   should I have any questions or need assistance in obtaining follow up care.    __________________________________________  _____________  __________ Signature of Patient or Authorized Representative            Date                   Time    __________________________________________ Nurse's Signature

## 2013-04-05 ENCOUNTER — Other Ambulatory Visit: Payer: Medicare Other | Admitting: Lab

## 2013-04-05 ENCOUNTER — Ambulatory Visit: Payer: Medicare Other | Admitting: Oncology

## 2013-04-06 ENCOUNTER — Ambulatory Visit: Payer: Medicare Other

## 2013-04-06 ENCOUNTER — Other Ambulatory Visit: Payer: Medicare Other | Admitting: Lab

## 2013-04-06 ENCOUNTER — Ambulatory Visit: Payer: Medicare Other | Admitting: Oncology

## 2013-04-07 ENCOUNTER — Other Ambulatory Visit: Payer: Medicare Other | Admitting: Lab

## 2013-04-07 ENCOUNTER — Ambulatory Visit: Payer: Medicare Other | Admitting: Oncology

## 2013-04-13 ENCOUNTER — Ambulatory Visit (HOSPITAL_BASED_OUTPATIENT_CLINIC_OR_DEPARTMENT_OTHER): Payer: Medicare Other

## 2013-04-13 ENCOUNTER — Ambulatory Visit: Payer: Medicare Other

## 2013-04-13 ENCOUNTER — Ambulatory Visit (HOSPITAL_BASED_OUTPATIENT_CLINIC_OR_DEPARTMENT_OTHER): Payer: Medicare Other | Admitting: Oncology

## 2013-04-13 ENCOUNTER — Other Ambulatory Visit (HOSPITAL_BASED_OUTPATIENT_CLINIC_OR_DEPARTMENT_OTHER): Payer: Medicare Other | Admitting: Lab

## 2013-04-13 ENCOUNTER — Telehealth: Payer: Self-pay | Admitting: Oncology

## 2013-04-13 ENCOUNTER — Telehealth: Payer: Self-pay | Admitting: *Deleted

## 2013-04-13 VITALS — BP 143/51 | Temp 97.2°F | Resp 18 | Ht 71.0 in | Wt 173.6 lb

## 2013-04-13 DIAGNOSIS — D649 Anemia, unspecified: Secondary | ICD-10-CM

## 2013-04-13 DIAGNOSIS — Z5111 Encounter for antineoplastic chemotherapy: Secondary | ICD-10-CM

## 2013-04-13 DIAGNOSIS — C679 Malignant neoplasm of bladder, unspecified: Secondary | ICD-10-CM

## 2013-04-13 LAB — COMPREHENSIVE METABOLIC PANEL (CC13)
BUN: 17 mg/dL (ref 7.0–26.0)
CO2: 25 mEq/L (ref 22–29)
Calcium: 9.4 mg/dL (ref 8.4–10.4)
Chloride: 108 mEq/L — ABNORMAL HIGH (ref 98–107)
Creatinine: 1.6 mg/dL — ABNORMAL HIGH (ref 0.7–1.3)

## 2013-04-13 LAB — CBC WITH DIFFERENTIAL/PLATELET
BASO%: 0.4 % (ref 0.0–2.0)
Eosinophils Absolute: 0.1 10*3/uL (ref 0.0–0.5)
HCT: 29.5 % — ABNORMAL LOW (ref 38.4–49.9)
LYMPH%: 27.4 % (ref 14.0–49.0)
MCHC: 31.9 g/dL — ABNORMAL LOW (ref 32.0–36.0)
MONO#: 0.7 10*3/uL (ref 0.1–0.9)
NEUT%: 55.5 % (ref 39.0–75.0)
Platelets: 152 10*3/uL (ref 140–400)
WBC: 4.9 10*3/uL (ref 4.0–10.3)

## 2013-04-13 MED ORDER — FOSAPREPITANT DIMEGLUMINE INJECTION 150 MG
150.0000 mg | Freq: Once | INTRAVENOUS | Status: AC
  Administered 2013-04-13: 150 mg via INTRAVENOUS
  Filled 2013-04-13: qty 5

## 2013-04-13 MED ORDER — SODIUM CHLORIDE 0.9 % IV SOLN
50.0000 mg/m2 | Freq: Once | INTRAVENOUS | Status: AC
  Administered 2013-04-13: 100 mg via INTRAVENOUS
  Filled 2013-04-13: qty 100

## 2013-04-13 MED ORDER — DEXAMETHASONE SODIUM PHOSPHATE 20 MG/5ML IJ SOLN
12.0000 mg | Freq: Once | INTRAMUSCULAR | Status: AC
  Administered 2013-04-13: 12 mg via INTRAVENOUS

## 2013-04-13 MED ORDER — PALONOSETRON HCL INJECTION 0.25 MG/5ML
0.2500 mg | Freq: Once | INTRAVENOUS | Status: AC
  Administered 2013-04-13: 0.25 mg via INTRAVENOUS

## 2013-04-13 MED ORDER — POTASSIUM CHLORIDE 2 MEQ/ML IV SOLN
Freq: Once | INTRAVENOUS | Status: AC
  Administered 2013-04-13: 10:00:00 via INTRAVENOUS
  Filled 2013-04-13: qty 10

## 2013-04-13 MED ORDER — SODIUM CHLORIDE 0.9 % IV SOLN
INTRAVENOUS | Status: DC
  Administered 2013-04-13: 10:00:00 via INTRAVENOUS

## 2013-04-13 MED ORDER — GEMCITABINE HCL CHEMO INJECTION 1 GM/26.3ML
1000.0000 mg/m2 | Freq: Once | INTRAVENOUS | Status: AC
  Administered 2013-04-13: 1976 mg via INTRAVENOUS
  Filled 2013-04-13: qty 52.03

## 2013-04-13 NOTE — Telephone Encounter (Signed)
Gave pt appt for lab, ML, chemo on May 2014 , pt will also see Ml and MD on June 2014 with labs and chemo ,emailed Marcelino Duster regarding chemo appy for June 2014

## 2013-04-13 NOTE — Patient Instructions (Addendum)
[facility] Discharge Instructions for Patients Receiving Chemotherapy  Today you received the following chemotherapy agents gemzar, cisplatin  To help prevent nausea and vomiting after your treatment, we encourage you to take your nausea medication if needed. Begin taking it at 6 pm and take it as often as prescribed.   If you develop nausea and vomiting that is not controlled by your nausea medication, call the clinic. If it is after clinic hours your family physician or the after hours number for the clinic or go to the Emergency Department.   BELOW ARE SYMPTOMS THAT SHOULD BE REPORTED IMMEDIATELY:  *FEVER GREATER THAN 100.5 F  *CHILLS WITH OR WITHOUT FEVER  NAUSEA AND VOMITING THAT IS NOT CONTROLLED WITH YOUR NAUSEA MEDICATION  *UNUSUAL SHORTNESS OF BREATH  *UNUSUAL BRUISING OR BLEEDING  TENDERNESS IN MOUTH AND THROAT WITH OR WITHOUT PRESENCE OF ULCERS  *URINARY PROBLEMS  *BOWEL PROBLEMS  UNUSUAL RASH Items with * indicate a potential emergency and should be followed up as soon as possible.  Feel free to call the clinic you have any questions or concerns. The clinic phone number is [phone].   I have been informed and understand all the instructions given to me. I know to contact the clinic, my physician, or go to the Emergency Department if any problems should occur. I do not have any questions at this time, but understand that I may call the clinic during office hours   should I have any questions or need assistance in obtaining follow up care.    __________________________________________  _____________  __________ Signature of Patient or Authorized Representative            Date                   Time    __________________________________________ Nurse's Signature

## 2013-04-13 NOTE — Progress Notes (Signed)
Hematology and Oncology Follow Up Visit  Michael Ball [id number] [birth year] 77 y.o. 04/13/2013 8:47 AM Michael Ball, [name], [name], MD   Principle Diagnosis: 77 year old with urothelial carcinoma of the bladder, high-grade, although it does not appear to be muscle invasive on the recent specimen.  Prior Therapy: He received 6 treatments of BCG that concluded in November of 2013. He had a repeat biopsy that was  done on 12/23/2012. The case number was [id number] which showed atypical urothelial cells suspicious for malignancy. That was the cytology report. The actual surgical pathology was done on 12/15/2012 and that was case number [id number] and showed urothelial carcinoma high-grade, papillary, noninvasive. The lamina propria is present but without invasion of tumor with question of lymph node involvement.   Current therapy: Neoadjuvant chemotherapy with Cisplatin and Gemzar started on 01/12/13. He is here for cycle 5 day 1.  Interim History:  Michael Ball is seen for routine follow-up today prior to his next chemotherapy. He continues to tolerate it well. Using anti-emetics routinely, with some success. No vomiting noted. He is not using Compazine routinely. Denies chest pain, SOB, DOE. No abdominal pain. States hematuria has now completely resolved. He has not other bleeding. He has mild fatigue, but is still able to perform most activity of daily living. He reports no new complications to cancer therapy. He still able to function well. No neuropathy or any other complications.   Medications: I have reviewed the patient's current medications.   Current Outpatient Prescriptions  Medication Sig Dispense Refill  . amLODipine (NORVASC) 10 MG tablet Take 5 mg by mouth daily.       Marland Kitchen LORazepam (ATIVAN) 0.5 MG tablet Take 1 tablet (0.5 mg total) by mouth every 8 (eight) hours.  30 tablet  1  . losartan-hydrochlorothiazide (HYZAAR) 100-25 MG per tablet Take 1 tablet by mouth daily.      .  ondansetron (ZOFRAN) 8 MG tablet Take 1 tablet (8 mg total) by mouth every 8 (eight) hours as needed for nausea.  20 tablet  2  . prochlorperazine (COMPAZINE) 10 MG tablet Take 1 tablet (10 mg total) by mouth every 6 (six) hours as needed.  30 tablet  1  . rosuvastatin (CRESTOR) 20 MG tablet Take 20 mg by mouth daily.      . temazepam (RESTORIL) 30 MG capsule Take 1 capsule by mouth at bedtime as needed.       No current facility-administered medications for this visit.    Allergies: No Known Allergies  Past Medical History, Surgical history, Social history, and Family History were reviewed and updated.  Review of Systems: Constitutional:  Negative for fever, chills, night sweats, anorexia, weight loss, pain. Cardiovascular: no chest pain or dyspnea on exertion Respiratory: no cough, shortness of breath, or wheezing Neurological: no TIA or stroke symptoms Dermatological: negative ENT: negative Skin: Negative. Gastrointestinal: no abdominal pain, change in bowel habits, or black or bloody stools Genito-Urinary: no dysuria, trouble voiding, or hematuria Hematological and Lymphatic: negative Breast: negative for breast lumps Musculoskeletal: negative Remaining ROS negative.  Physical Exam: Blood pressure 143/51, temperature 97.2 F (36.2 C), temperature source Oral, resp. rate 18, height 5\' 11"  (1.803 m), weight 173 lb 9.6 oz (78.744 kg). ECOG: 1 General appearance: alert, cooperative and no distress Head: Normocephalic, without obvious abnormality, atraumatic Neck: no adenopathy, no carotid bruit, no JVD, supple, symmetrical, trachea midline and thyroid not enlarged, symmetric, no tenderness/mass/nodules Lymph nodes: Cervical, supraclavicular, and axillary nodes normal. Heart:regular rate and rhythm, S1,  S2 normal, no murmur, click, rub or gallop Lung:chest clear, no wheezing, rales, normal symmetric air entry, no tachypnea, retractions or cyanosis Abdomen: soft, non-tender,  without masses or organomegaly EXT:no erythema, induration, or nodules   Lab Results: Lab Results  Component Value Date   WBC 4.9 04/13/2013   HGB 9.4* 04/13/2013   HCT 29.5* 04/13/2013   MCV 97.4 04/13/2013   PLT 152 04/13/2013     Chemistry      Component Value Date/Time   NA 141 03/30/2013 1114   K 4.0 03/30/2013 1114   CL 107 03/30/2013 1114   CO2 22 03/30/2013 1114   BUN 22.2 03/30/2013 1114   CREATININE 1.7* 03/30/2013 1114      Component Value Date/Time   CALCIUM 9.3 03/30/2013 1114   ALKPHOS 71 03/30/2013 1114   AST 24 03/30/2013 1114   ALT 17 03/30/2013 1114   BILITOT 0.49 03/30/2013 1114     Impression and Plan: This is a 77 year old male with the following issues: 1. Urothelial carcinoma of the bladder, high grade. Currently on neoadjuvant chemotherapy with Cisplatin and Gemzar. He is here for cycle 5 day 1. Recommend that he proceed with chemotherapy today without further dose modification. Plan for a total of 6 cycles.    2. Bradycardia. He is followed by Cardiology. Asymptomatic at this time.  3. Nausea/vomiting prophylaxis. He has Zofran. Compazine, and Ativan at home. Compazine makes him feel anxious. Under control now. 4. Follow up in 1 weeks for cycle 5 day 14. 5. Anemia: asymptomatic at this time. No need for transfusion or growth facto support.      Michael Ball 5/20/20148:47 AM

## 2013-04-13 NOTE — Progress Notes (Signed)
Voided 300 cc before cisplatin started. Voided 300+ cc after cisplatin started.

## 2013-04-13 NOTE — Telephone Encounter (Signed)
Per staff message and POF I have scheduled appts.  JMW  

## 2013-04-20 ENCOUNTER — Ambulatory Visit (HOSPITAL_BASED_OUTPATIENT_CLINIC_OR_DEPARTMENT_OTHER): Payer: Medicare Other

## 2013-04-20 ENCOUNTER — Ambulatory Visit (HOSPITAL_BASED_OUTPATIENT_CLINIC_OR_DEPARTMENT_OTHER): Payer: Medicare Other | Admitting: Oncology

## 2013-04-20 ENCOUNTER — Other Ambulatory Visit (HOSPITAL_BASED_OUTPATIENT_CLINIC_OR_DEPARTMENT_OTHER): Payer: Medicare Other | Admitting: Lab

## 2013-04-20 ENCOUNTER — Telehealth: Payer: Self-pay | Admitting: Oncology

## 2013-04-20 ENCOUNTER — Encounter: Payer: Self-pay | Admitting: Oncology

## 2013-04-20 VITALS — BP 156/51 | HR 77 | Temp 98.4°F | Resp 18 | Ht 71.0 in | Wt 167.8 lb

## 2013-04-20 DIAGNOSIS — C679 Malignant neoplasm of bladder, unspecified: Secondary | ICD-10-CM

## 2013-04-20 DIAGNOSIS — D649 Anemia, unspecified: Secondary | ICD-10-CM

## 2013-04-20 DIAGNOSIS — C677 Malignant neoplasm of urachus: Secondary | ICD-10-CM

## 2013-04-20 DIAGNOSIS — Z5111 Encounter for antineoplastic chemotherapy: Secondary | ICD-10-CM

## 2013-04-20 LAB — CBC WITH DIFFERENTIAL/PLATELET
Basophils Absolute: 0 10*3/uL (ref 0.0–0.1)
Eosinophils Absolute: 0 10*3/uL (ref 0.0–0.5)
HCT: 29 % — ABNORMAL LOW (ref 38.4–49.9)
HGB: 9.5 g/dL — ABNORMAL LOW (ref 13.0–17.1)
LYMPH%: 37.7 % (ref 14.0–49.0)
MCV: 96.3 fL (ref 79.3–98.0)
MONO%: 8 % (ref 0.0–14.0)
NEUT#: 1.7 10*3/uL (ref 1.5–6.5)
Platelets: 175 10*3/uL (ref 140–400)
RDW: 15.8 % — ABNORMAL HIGH (ref 11.0–14.6)

## 2013-04-20 LAB — COMPREHENSIVE METABOLIC PANEL (CC13)
CO2: 24 mEq/L (ref 22–29)
Glucose: 93 mg/dl (ref 70–99)
Sodium: 141 mEq/L (ref 136–145)
Total Bilirubin: 0.47 mg/dL (ref 0.20–1.20)
Total Protein: 7.1 g/dL (ref 6.4–8.3)

## 2013-04-20 MED ORDER — ONDANSETRON HCL 8 MG PO TABS: 8.0000 mg | ORAL_TABLET | Freq: Three times a day (TID) | ORAL | Status: DC | PRN

## 2013-04-20 MED ORDER — ONDANSETRON 8 MG/50ML IVPB (CHCC)
8.0000 mg | Freq: Once | INTRAVENOUS | Status: AC
Start: 2013-04-20 — End: 2013-04-20
  Administered 2013-04-20: 8 mg via INTRAVENOUS

## 2013-04-20 MED ORDER — PROCHLORPERAZINE MALEATE 10 MG PO TABS: 10.0000 mg | ORAL_TABLET | Freq: Four times a day (QID) | ORAL | Status: DC | PRN

## 2013-04-20 MED ORDER — SODIUM CHLORIDE 0.9 % IV SOLN
Freq: Once | INTRAVENOUS | Status: AC
  Administered 2013-04-20: 15:00:00 via INTRAVENOUS

## 2013-04-20 MED ORDER — LORAZEPAM 0.5 MG PO TABS: 0.5000 mg | ORAL_TABLET | Freq: Three times a day (TID) | ORAL | Status: DC

## 2013-04-20 MED ORDER — SODIUM CHLORIDE 0.9 % IV SOLN
1000.0000 mg/m2 | Freq: Once | INTRAVENOUS | Status: AC
  Administered 2013-04-20: 1976 mg via INTRAVENOUS
  Filled 2013-04-20: qty 52.03

## 2013-04-20 MED ORDER — DEXAMETHASONE SODIUM PHOSPHATE 10 MG/ML IJ SOLN
10.0000 mg | Freq: Once | INTRAMUSCULAR | Status: AC
  Administered 2013-04-20: 10 mg via INTRAVENOUS

## 2013-04-20 NOTE — Progress Notes (Signed)
Hematology and Oncology Follow Up Visit  Michael Ball [id number] November 15, 1936 77 y.o. 04/20/2013 12:37 PM MichaelMichael Ball, [name], Michael Bonds, MD   Principle Diagnosis: 77 year old with urothelial carcinoma of the bladder, high-grade, although it does not appear to be muscle invasive on the recent specimen.  Prior Therapy: He received 6 treatments of BCG that concluded in November of 2013. He had a repeat biopsy that was  done on 12/23/2012. The case number was [id number] which showed atypical urothelial cells suspicious for malignancy. That was the cytology report. The actual surgical pathology was done on 12/15/2012 and that was case number [id number] and showed urothelial carcinoma high-grade, papillary, noninvasive. The lamina propria is present but without invasion of tumor with question of lymph node involvement.   Current therapy: Neoadjuvant chemotherapy with Cisplatin and Gemzar started on 01/12/13. He is here for cycle 5 day 8.  Interim History:  Mr Michael Ball is seen for routine follow-up today prior to his next chemotherapy. He continues to tolerate it well. Using anti-emetics routinely, with some success. No vomiting noted. Denies chest pain, SOB, DOE. No abdominal pain. States hematuria has now completely resolved. He has not other bleeding. He has mild fatigue, but is still able to perform most activity of daily living. He reports no new complications to cancer therapy. He still able to function well. No neuropathy or any other complications.   Medications: I have reviewed the patient's current medications.   Current Outpatient Prescriptions  Medication Sig Dispense Refill  . amLODipine (NORVASC) 10 MG tablet Take 5 mg by mouth daily.       Marland Kitchen LORazepam (ATIVAN) 0.5 MG tablet Take 1 tablet (0.5 mg total) by mouth every 8 (eight) hours.  30 tablet  1  . ondansetron (ZOFRAN) 8 MG tablet Take 1 tablet (8 mg total) by mouth every 8 (eight) hours as needed for nausea.  20 tablet  2  .  prochlorperazine (COMPAZINE) 10 MG tablet Take 1 tablet (10 mg total) by mouth every 6 (six) hours as needed.  60 tablet  1  . rosuvastatin (CRESTOR) 20 MG tablet Take 20 mg by mouth daily.      . temazepam (RESTORIL) 30 MG capsule Take 1 capsule by mouth at bedtime as needed.       No current facility-administered medications for this visit.    Allergies: No Known Allergies  Past Medical History, Surgical history, Social history, and Family History were reviewed and updated.  Review of Systems: Constitutional:  Negative for fever, chills, night sweats, anorexia, weight loss, pain. Cardiovascular: no chest pain or dyspnea on exertion Respiratory: no cough, shortness of breath, or wheezing Neurological: no TIA or stroke symptoms Dermatological: negative ENT: negative Skin: Negative. Gastrointestinal: no abdominal pain, change in bowel habits, or black or bloody stools Genito-Urinary: no dysuria, trouble voiding, or hematuria Hematological and Lymphatic: negative Breast: negative for breast lumps Musculoskeletal: negative Remaining ROS negative.  Physical Exam: Blood pressure 156/51, pulse 77, temperature 98.4 F (36.9 C), temperature source Oral, resp. rate 18, height 5\' 11"  (1.803 m), weight 167 lb 12.8 oz (76.114 kg), SpO2 100.00%. ECOG: 1 General appearance: alert, cooperative and no distress Head: Normocephalic, without obvious abnormality, atraumatic Neck: no adenopathy, no carotid bruit, no JVD, supple, symmetrical, trachea midline and thyroid not enlarged, symmetric, no tenderness/mass/nodules Lymph nodes: Cervical, supraclavicular, and axillary nodes normal. Heart:regular rate and rhythm, S1, S2 normal, no murmur, click, rub or gallop Lung:chest clear, no wheezing, rales, normal symmetric air entry, no tachypnea, retractions  or cyanosis Abdomen: soft, non-tender, without masses or organomegaly EXT:no erythema, induration, or nodules   Lab Results: Lab Results   Component Value Date   WBC 3.2* 04/20/2013   HGB 9.5* 04/20/2013   HCT 29.0* 04/20/2013   MCV 96.3 04/20/2013   PLT 175 04/20/2013     Chemistry      Component Value Date/Time   NA 142 04/13/2013 0757   K 4.3 04/13/2013 0757   CL 108* 04/13/2013 0757   CO2 25 04/13/2013 0757   BUN 17.0 04/13/2013 0757   CREATININE 1.6* 04/13/2013 0757      Component Value Date/Time   CALCIUM 9.4 04/13/2013 0757   ALKPHOS 66 04/13/2013 0757   AST 18 04/13/2013 0757   ALT 10 04/13/2013 0757   BILITOT 0.44 04/13/2013 0757     Impression and Plan: This is a 77 year old male with the following issues: 1. Urothelial carcinoma of the bladder, high grade. Currently on neoadjuvant chemotherapy with Cisplatin and Gemzar. He is here for cycle 5 day 8. Recommend that he proceed with chemotherapy today without further dose modification. Plan for a total of 6 cycles.    2. Bradycardia. He is followed by Cardiology. Asymptomatic at this time.  3. Nausea/vomiting prophylaxis. He has Zofran. Compazine, and Ativan at home. Under control now. 4. Follow up in 1 weeks for cycle 5 day 8. 5. Anemia: asymptomatic at this time. No need for transfusion or growth factor support.      Michael Ball 5/27/201412:37 PM

## 2013-04-20 NOTE — Telephone Encounter (Signed)
Called pt left message regarding appt for June 2014

## 2013-04-20 NOTE — Patient Instructions (Addendum)
[facility] Discharge Instructions for Patients Receiving Chemotherapy  Today you received the following chemotherapy agents:  Gemzar  To help prevent nausea and vomiting after your treatment, we encourage you to take your nausea medication as ordered per MD.    If you develop nausea and vomiting that is not controlled by your nausea medication, call the clinic. If it is after clinic hours your family physician or the after hours number for the clinic or go to the Emergency Department.   BELOW ARE SYMPTOMS THAT SHOULD BE REPORTED IMMEDIATELY:  *FEVER GREATER THAN 100.5 F  *CHILLS WITH OR WITHOUT FEVER  NAUSEA AND VOMITING THAT IS NOT CONTROLLED WITH YOUR NAUSEA MEDICATION  *UNUSUAL SHORTNESS OF BREATH  *UNUSUAL BRUISING OR BLEEDING  TENDERNESS IN MOUTH AND THROAT WITH OR WITHOUT PRESENCE OF ULCERS  *URINARY PROBLEMS  *BOWEL PROBLEMS  UNUSUAL RASH Items with * indicate a potential emergency and should be followed up as soon as possible.   Please let the nurse know about any problems that you may have experienced. Feel free to call the clinic you have any questions or concerns. The clinic phone number is [phone].   I have been informed and understand all the instructions given to me. I know to contact the clinic, my physician, or go to the Emergency Department if any problems should occur. I do not have any questions at this time, but understand that I may call the clinic during office hours   should I have any questions or need assistance in obtaining follow up care.    __________________________________________  _____________  __________ Signature of Patient or Authorized Representative            Date                   Time    __________________________________________ Nurse's Signature

## 2013-05-04 ENCOUNTER — Other Ambulatory Visit (HOSPITAL_BASED_OUTPATIENT_CLINIC_OR_DEPARTMENT_OTHER): Payer: Medicare Other | Admitting: Lab

## 2013-05-04 ENCOUNTER — Telehealth: Payer: Self-pay | Admitting: Oncology

## 2013-05-04 ENCOUNTER — Ambulatory Visit (HOSPITAL_BASED_OUTPATIENT_CLINIC_OR_DEPARTMENT_OTHER): Payer: Medicare Other

## 2013-05-04 ENCOUNTER — Ambulatory Visit (HOSPITAL_BASED_OUTPATIENT_CLINIC_OR_DEPARTMENT_OTHER): Payer: Medicare Other | Admitting: Oncology

## 2013-05-04 VITALS — BP 155/56 | HR 42 | Temp 97.2°F | Resp 18 | Ht 71.0 in | Wt 172.6 lb

## 2013-05-04 DIAGNOSIS — C677 Malignant neoplasm of urachus: Secondary | ICD-10-CM

## 2013-05-04 DIAGNOSIS — Z5111 Encounter for antineoplastic chemotherapy: Secondary | ICD-10-CM

## 2013-05-04 DIAGNOSIS — D649 Anemia, unspecified: Secondary | ICD-10-CM

## 2013-05-04 DIAGNOSIS — C679 Malignant neoplasm of bladder, unspecified: Secondary | ICD-10-CM

## 2013-05-04 LAB — COMPREHENSIVE METABOLIC PANEL (CC13)
Albumin: 3.8 g/dL (ref 3.5–5.0)
BUN: 28.6 mg/dL — ABNORMAL HIGH (ref 7.0–26.0)
Calcium: 9.7 mg/dL (ref 8.4–10.4)
Chloride: 110 mEq/L — ABNORMAL HIGH (ref 98–107)
Creatinine: 1.8 mg/dL — ABNORMAL HIGH (ref 0.7–1.3)
Glucose: 96 mg/dl (ref 70–99)
Potassium: 4.5 mEq/L (ref 3.5–5.1)

## 2013-05-04 LAB — CBC WITH DIFFERENTIAL/PLATELET
BASO%: 0.6 % (ref 0.0–2.0)
Basophils Absolute: 0 10*3/uL (ref 0.0–0.1)
EOS%: 1.7 % (ref 0.0–7.0)
HCT: 28.7 % — ABNORMAL LOW (ref 38.4–49.9)
HGB: 9.3 g/dL — ABNORMAL LOW (ref 13.0–17.1)
LYMPH%: 24.3 % (ref 14.0–49.0)
MCH: 31.8 pg (ref 27.2–33.4)
MCHC: 32.4 g/dL (ref 32.0–36.0)
NEUT%: 59.5 % (ref 39.0–75.0)
Platelets: 152 10*3/uL (ref 140–400)
lymph#: 1.2 10*3/uL (ref 0.9–3.3)

## 2013-05-04 MED ORDER — SODIUM CHLORIDE 0.9 % IV SOLN
150.0000 mg | Freq: Once | INTRAVENOUS | Status: AC
  Administered 2013-05-04: 150 mg via INTRAVENOUS
  Filled 2013-05-04: qty 5

## 2013-05-04 MED ORDER — SODIUM CHLORIDE 0.9 % IV SOLN
1000.0000 mg/m2 | Freq: Once | INTRAVENOUS | Status: AC
  Administered 2013-05-04: 1976 mg via INTRAVENOUS
  Filled 2013-05-04: qty 52.03

## 2013-05-04 MED ORDER — SODIUM CHLORIDE 0.9 % IV SOLN: INTRAVENOUS | Status: DC

## 2013-05-04 MED ORDER — SODIUM CHLORIDE 0.9 % IV SOLN
50.0000 mg/m2 | Freq: Once | INTRAVENOUS | Status: AC
  Administered 2013-05-04: 100 mg via INTRAVENOUS
  Filled 2013-05-04: qty 100

## 2013-05-04 MED ORDER — DEXAMETHASONE SODIUM PHOSPHATE 20 MG/5ML IJ SOLN
12.0000 mg | Freq: Once | INTRAMUSCULAR | Status: AC
  Administered 2013-05-04: 12 mg via INTRAVENOUS

## 2013-05-04 MED ORDER — POTASSIUM CHLORIDE 2 MEQ/ML IV SOLN
Freq: Once | INTRAVENOUS | Status: AC
  Administered 2013-05-04: 10:00:00 via INTRAVENOUS
  Filled 2013-05-04: qty 10

## 2013-05-04 MED ORDER — PALONOSETRON HCL INJECTION 0.25 MG/5ML
0.2500 mg | Freq: Once | INTRAVENOUS | Status: AC
  Administered 2013-05-04: 0.25 mg via INTRAVENOUS

## 2013-05-04 NOTE — Progress Notes (Signed)
Hematology and Oncology Follow Up Visit  Michael Ball [id number] Jul 01, 1936 77 y.o. 05/04/2013 8:38 AM Michael Ball,Michael Ball, [name], Michael Bonds, MD   Principle Diagnosis: 77 year old with urothelial carcinoma of the bladder, high-grade, although it does not appear to be muscle invasive on the recent specimen.  Prior Therapy: He received 6 treatments of BCG that concluded in November of 2013. He had a repeat biopsy that was  done on 12/23/2012. The case number was [id number] which showed atypical urothelial cells suspicious for malignancy. That was the cytology report. The actual surgical pathology was done on 12/15/2012 and that was case number [id number] and showed urothelial carcinoma high-grade, papillary, noninvasive. The lamina propria is present but without invasion of tumor with question of lymph node involvement.   Current therapy: Neoadjuvant chemotherapy with Cisplatin and Gemzar started on 01/12/13. He is here for cycle 6 day 1.  Interim History:  Michael Ball is seen for routine follow-up today prior to his next chemotherapy. He continues to tolerate it well. Using anti-emetics routinely, and eating very well. He gained 3-4 lbs since his last visit. No vomiting noted. Denies chest pain, SOB, DOE. No abdominal pain. States hematuria has now completely resolved. He has not other bleeding. He has mild fatigue, but is still able to perform most activity of daily living. He reports no new complications to cancer therapy. He still able to function well. No neuropathy or any other complications.   Medications: I have reviewed the patient's current medications.   Current Outpatient Prescriptions  Medication Sig Dispense Refill  . amLODipine (NORVASC) 10 MG tablet Take 5 mg by mouth daily.       Marland Kitchen LORazepam (ATIVAN) 0.5 MG tablet Take 1 tablet (0.5 mg total) by mouth every 8 (eight) hours.  30 tablet  1  . ondansetron (ZOFRAN) 8 MG tablet Take 1 tablet (8 mg total) by mouth every 8 (eight) hours as  needed for nausea.  20 tablet  2  . prochlorperazine (COMPAZINE) 10 MG tablet Take 1 tablet (10 mg total) by mouth every 6 (six) hours as needed.  60 tablet  1  . rosuvastatin (CRESTOR) 20 MG tablet Take 20 mg by mouth daily.      . temazepam (RESTORIL) 30 MG capsule Take 1 capsule by mouth at bedtime as needed.       No current facility-administered medications for this visit.    Allergies: No Known Allergies  Past Medical History, Surgical history, Social history, and Family History were reviewed and updated.  Review of Systems: Constitutional:  Negative for fever, chills, night sweats, anorexia, weight loss, pain. Cardiovascular: no chest pain or dyspnea on exertion Respiratory: no cough, shortness of breath, or wheezing Neurological: no TIA or stroke symptoms Dermatological: negative ENT: negative Skin: Negative. Gastrointestinal: no abdominal pain, change in bowel habits, or black or bloody stools Genito-Urinary: no dysuria, trouble voiding, or hematuria Hematological and Lymphatic: negative Breast: negative for breast lumps Musculoskeletal: negative Remaining ROS negative.  Physical Exam: Blood pressure 155/56, pulse 42, temperature 97.2 F (36.2 C), temperature source Oral, resp. rate 18, height 5\' 11"  (1.803 m), weight 172 lb 9.6 oz (78.291 kg). ECOG: 1 General appearance: alert, cooperative and no distress Head: Normocephalic, without obvious abnormality, atraumatic Neck: no adenopathy, no carotid bruit, no JVD, supple, symmetrical, trachea midline and thyroid not enlarged, symmetric, no tenderness/mass/nodules Lymph nodes: Cervical, supraclavicular, and axillary nodes normal. Heart:regular rate and rhythm, S1, S2 normal, no murmur, click, rub or gallop Lung:chest clear, no wheezing, rales,  normal symmetric air entry, no tachypnea, retractions or cyanosis Abdomen: soft, non-tender, without masses or organomegaly EXT:no erythema, induration, or nodules   Lab  Results: Lab Results  Component Value Date   WBC 4.7 05/04/2013   HGB 9.3* 05/04/2013   HCT 28.7* 05/04/2013   MCV 98.3* 05/04/2013   PLT 152 05/04/2013     Chemistry      Component Value Date/Time   NA 141 04/20/2013 1105   K 4.1 04/20/2013 1105   CL 106 04/20/2013 1105   CO2 24 04/20/2013 1105   BUN 25.5 04/20/2013 1105   CREATININE 1.8* 04/20/2013 1105      Component Value Date/Time   CALCIUM 9.3 04/20/2013 1105   ALKPHOS 66 04/20/2013 1105   AST 23 04/20/2013 1105   ALT 16 04/20/2013 1105   BILITOT 0.47 04/20/2013 1105     Impression and Plan: This is a 77 year old male with the following issues: 1. Urothelial carcinoma of the bladder, high grade. Currently on neoadjuvant chemotherapy with Cisplatin and Gemzar. He is here for cycle 6 day 1. Recommend that he proceed with chemotherapy today without further dose modification. Plan for a total of 6 cycles. He will get a CT scan at [facility] on 7/7 possible evaluation for salvage surgery.  2. Bradycardia. He is followed by Cardiology. Asymptomatic at this time.  3. Nausea/vomiting prophylaxis. He has Zofran. Compazine, and Ativan at home. Under control now. 4. Follow up in 1 weeks for cycle 6 day 8. 5. Anemia: asymptomatic at this time. No need for transfusion or growth factor support.  6. Slight increase in Cr: I will check his kidney function again today before treatment.      Michael Ball 6/10/20148:38 AM

## 2013-05-04 NOTE — Telephone Encounter (Signed)
gv and printed appt sched and avs for pt  °

## 2013-05-04 NOTE — Progress Notes (Signed)
Prehydration UOP: 400  Post hydration UOP: 650

## 2013-05-04 NOTE — Patient Instructions (Addendum)
[facility] Discharge Instructions for Patients Receiving Chemotherapy  Today you received the following chemotherapy agents cisplatin, gemzar  To help prevent nausea and vomiting after your treatment, we encourage you to take your nausea medication as needed   If you develop nausea and vomiting that is not controlled by your nausea medication, call the clinic.   BELOW ARE SYMPTOMS THAT SHOULD BE REPORTED IMMEDIATELY:  *FEVER GREATER THAN 100.5 F  *CHILLS WITH OR WITHOUT FEVER  NAUSEA AND VOMITING THAT IS NOT CONTROLLED WITH YOUR NAUSEA MEDICATION  *UNUSUAL SHORTNESS OF BREATH  *UNUSUAL BRUISING OR BLEEDING  TENDERNESS IN MOUTH AND THROAT WITH OR WITHOUT PRESENCE OF ULCERS  *URINARY PROBLEMS  *BOWEL PROBLEMS  UNUSUAL RASH Items with * indicate a potential emergency and should be followed up as soon as possible.  Feel free to call the clinic you have any questions or concerns. The clinic phone number is [phone].

## 2013-05-04 NOTE — Progress Notes (Signed)
Per Dr Clelia Croft, pt need STAT CMET to evaluate creatinine level.    Cr 1.8, per Dr Clelia Croft, okay to proceed with chemo CDDP and gemzar.  SLJ  Decadron and emend infused together over 30 minutes

## 2013-05-11 ENCOUNTER — Other Ambulatory Visit (HOSPITAL_BASED_OUTPATIENT_CLINIC_OR_DEPARTMENT_OTHER): Payer: Medicare Other

## 2013-05-11 ENCOUNTER — Ambulatory Visit (HOSPITAL_BASED_OUTPATIENT_CLINIC_OR_DEPARTMENT_OTHER): Payer: Medicare Other

## 2013-05-11 ENCOUNTER — Encounter: Payer: Self-pay | Admitting: Oncology

## 2013-05-11 ENCOUNTER — Ambulatory Visit (HOSPITAL_BASED_OUTPATIENT_CLINIC_OR_DEPARTMENT_OTHER): Payer: Medicare Other | Admitting: Oncology

## 2013-05-11 VITALS — BP 152/58 | HR 42 | Temp 97.3°F | Resp 18 | Ht 71.0 in | Wt 167.6 lb

## 2013-05-11 DIAGNOSIS — C679 Malignant neoplasm of bladder, unspecified: Secondary | ICD-10-CM

## 2013-05-11 DIAGNOSIS — C677 Malignant neoplasm of urachus: Secondary | ICD-10-CM

## 2013-05-11 DIAGNOSIS — Z5111 Encounter for antineoplastic chemotherapy: Secondary | ICD-10-CM

## 2013-05-11 DIAGNOSIS — D649 Anemia, unspecified: Secondary | ICD-10-CM | POA: Insufficient documentation

## 2013-05-11 LAB — COMPREHENSIVE METABOLIC PANEL (CC13)
BUN: 34.8 mg/dL — ABNORMAL HIGH (ref 7.0–26.0)
CO2: 25 mEq/L (ref 22–29)
Calcium: 9.8 mg/dL (ref 8.4–10.4)
Chloride: 105 mEq/L (ref 98–107)
Creatinine: 1.8 mg/dL — ABNORMAL HIGH (ref 0.7–1.3)
Glucose: 97 mg/dl (ref 70–99)
Total Bilirubin: 0.5 mg/dL (ref 0.20–1.20)

## 2013-05-11 LAB — CBC WITH DIFFERENTIAL/PLATELET
Eosinophils Absolute: 0 10*3/uL (ref 0.0–0.5)
HCT: 27.7 % — ABNORMAL LOW (ref 38.4–49.9)
LYMPH%: 44.9 % (ref 14.0–49.0)
MCHC: 32.9 g/dL (ref 32.0–36.0)
MCV: 94.2 fL (ref 79.3–98.0)
MONO#: 0.2 10*3/uL (ref 0.1–0.9)
MONO%: 9.5 % (ref 0.0–14.0)
NEUT#: 1.1 10*3/uL — ABNORMAL LOW (ref 1.5–6.5)
NEUT%: 43.2 % (ref 39.0–75.0)
Platelets: 176 10*3/uL (ref 140–400)
WBC: 2.4 10*3/uL — ABNORMAL LOW (ref 4.0–10.3)

## 2013-05-11 LAB — TECHNOLOGIST REVIEW

## 2013-05-11 MED ORDER — SODIUM CHLORIDE 0.9 % IV SOLN
1000.0000 mg/m2 | Freq: Once | INTRAVENOUS | Status: AC
  Administered 2013-05-11: 1976 mg via INTRAVENOUS
  Filled 2013-05-11: qty 52.03

## 2013-05-11 MED ORDER — ONDANSETRON 8 MG/50ML IVPB (CHCC)
8.0000 mg | Freq: Once | INTRAVENOUS | Status: AC
  Administered 2013-05-11: 8 mg via INTRAVENOUS

## 2013-05-11 MED ORDER — DEXAMETHASONE SODIUM PHOSPHATE 10 MG/ML IJ SOLN
10.0000 mg | Freq: Once | INTRAMUSCULAR | Status: AC
  Administered 2013-05-11: 10 mg via INTRAVENOUS

## 2013-05-11 MED ORDER — SODIUM CHLORIDE 0.9 % IV SOLN
Freq: Once | INTRAVENOUS | Status: AC
  Administered 2013-05-11: 10:00:00 via INTRAVENOUS

## 2013-05-11 NOTE — Progress Notes (Signed)
Per Jenell Milliner, NP, OK to treat despite today's counts.

## 2013-05-11 NOTE — Progress Notes (Signed)
Hematology and Oncology Follow Up Visit  Nashaun Hillmer [id number] 02-Sep-1936 77 y.o. 05/11/2013 9:15 AM [name],[name], [name], [name], MD   Principle Diagnosis: 77 year old with urothelial carcinoma of the bladder, high-grade, although it does not appear to be muscle invasive on the recent specimen.  Prior Therapy: He received 6 treatments of BCG that concluded in November of 2013. He had a repeat biopsy that was  done on 12/23/2012. The case number was [id number] which showed atypical urothelial cells suspicious for malignancy. That was the cytology report. The actual surgical pathology was done on 12/15/2012 and that was case number [id number] and showed urothelial carcinoma high-grade, papillary, noninvasive. The lamina propria is present but without invasion of tumor with question of lymph node involvement.   Current therapy: Neoadjuvant chemotherapy with Cisplatin and Gemzar started on 01/12/13. He is here for cycle 6 day 8.  Interim History:  Mr Digirolamo is seen for routine follow-up today prior to his next chemotherapy. He continues to tolerate it well. Using anti-emetics routinely, and eating very well. No vomiting noted. Denies chest pain, SOB, DOE. No abdominal pain. States hematuria has now completely resolved. He has not other bleeding. He has mild fatigue, but is still able to perform most activity of daily living. He reports no new complications to cancer therapy. He still able to function well. No neuropathy or any other complications.   Medications: I have reviewed the patient's current medications.   Current Outpatient Prescriptions  Medication Sig Dispense Refill  . amLODipine (NORVASC) 10 MG tablet Take 5 mg by mouth daily.       Marland Kitchen LORazepam (ATIVAN) 0.5 MG tablet Take 1 tablet (0.5 mg total) by mouth every 8 (eight) hours.  30 tablet  1  . ondansetron (ZOFRAN) 8 MG tablet Take 1 tablet (8 mg total) by mouth every 8 (eight) hours as needed for nausea.  20 tablet  2  .  prochlorperazine (COMPAZINE) 10 MG tablet Take 1 tablet (10 mg total) by mouth every 6 (six) hours as needed.  60 tablet  1  . rosuvastatin (CRESTOR) 20 MG tablet Take 20 mg by mouth daily.      . temazepam (RESTORIL) 30 MG capsule Take 1 capsule by mouth at bedtime as needed.       No current facility-administered medications for this visit.    Allergies: No Known Allergies  Past Medical History, Surgical history, Social history, and Family History were reviewed and updated.  Review of Systems: Constitutional:  Negative for fever, chills, night sweats, anorexia, weight loss, pain. Cardiovascular: no chest pain or dyspnea on exertion Respiratory: no cough, shortness of breath, or wheezing Neurological: no TIA or stroke symptoms Dermatological: negative ENT: negative Skin: Negative. Gastrointestinal: no abdominal pain, change in bowel habits, or black or bloody stools Genito-Urinary: no dysuria, trouble voiding, or hematuria Hematological and Lymphatic: negative Breast: negative for breast lumps Musculoskeletal: negative Remaining ROS negative.  Physical Exam: Blood pressure 152/58, pulse 42, temperature 97.3 F (36.3 C), temperature source Oral, resp. rate 18, height 5\' 11"  (1.803 m), weight 167 lb 9.6 oz (76.023 kg). ECOG: 1 General appearance: alert, cooperative and no distress Head: Normocephalic, without obvious abnormality, atraumatic Neck: no adenopathy, no carotid bruit, no JVD, supple, symmetrical, trachea midline and thyroid not enlarged, symmetric, no tenderness/mass/nodules Lymph nodes: Cervical, supraclavicular, and axillary nodes normal. Heart:regular rate and rhythm, S1, S2 normal, no murmur, click, rub or gallop Lung:chest clear, no wheezing, rales, normal symmetric air entry, no tachypnea, retractions or  cyanosis Abdomen: soft, non-tender, without masses or organomegaly EXT:no erythema, induration, or nodules   Lab Results: Lab Results  Component Value Date    WBC 2.4* 05/11/2013   HGB 9.1* 05/11/2013   HCT 27.7* 05/11/2013   MCV 94.2 05/11/2013   PLT 176 05/11/2013     Chemistry      Component Value Date/Time   NA 145 05/04/2013 0802   K 4.5 05/04/2013 0802   CL 110* 05/04/2013 0802   CO2 24 05/04/2013 0802   BUN 28.6* 05/04/2013 0802   CREATININE 1.8* 05/04/2013 0802      Component Value Date/Time   CALCIUM 9.7 05/04/2013 0802   ALKPHOS 67 05/04/2013 0802   AST 17 05/04/2013 0802   ALT 11 05/04/2013 0802   BILITOT 0.41 05/04/2013 0802     Impression and Plan: This is a 77 year old male with the following issues: 1. Urothelial carcinoma of the bladder, high grade. Currently on neoadjuvant chemotherapy with Cisplatin and Gemzar. He is here for cycle 6 day 8. Recommend that he proceed with chemotherapy today without further dose modification. Plan for a total of 6 cycles. He will get a CT scan at [facility] on 7/7 possible evaluation for salvage surgery.  2. Bradycardia. He is followed by Cardiology. Asymptomatic at this time.  3. Nausea/vomiting prophylaxis. He has Zofran. Compazine, and Ativan at home. Under control now. 4. Follow up on 7/10. 5. Anemia: asymptomatic at this time. No need for transfusion or growth factor support.  6. Slight increase in Cr: Will continue to monitor kidney function.      [place], [place] 6/17/20149:15 AM

## 2013-05-11 NOTE — Patient Instructions (Addendum)
Cancer Center Discharge Instructions for Patients Receiving Chemotherapy  Today you received the following chemotherapy agents Gemzar.  To help prevent nausea and vomiting after your treatment, we encourage you to take your nausea medication as prescribed.   If you develop nausea and vomiting that is not controlled by your nausea medication, call the clinic.   BELOW ARE SYMPTOMS THAT SHOULD BE REPORTED IMMEDIATELY:  *FEVER GREATER THAN 100.5 F  *CHILLS WITH OR WITHOUT FEVER  NAUSEA AND VOMITING THAT IS NOT CONTROLLED WITH YOUR NAUSEA MEDICATION  *UNUSUAL SHORTNESS OF BREATH  *UNUSUAL BRUISING OR BLEEDING  TENDERNESS IN MOUTH AND THROAT WITH OR WITHOUT PRESENCE OF ULCERS  *URINARY PROBLEMS  *BOWEL PROBLEMS  UNUSUAL RASH Items with * indicate a potential emergency and should be followed up as soon as possible.  Feel free to call the clinic you have any questions or concerns. The clinic phone number is [phone].    

## 2013-06-01 ENCOUNTER — Telehealth: Payer: Self-pay | Admitting: Oncology

## 2013-06-01 NOTE — Telephone Encounter (Signed)
Sent medical records to [facility] at [facility] for patient care

## 2013-06-03 ENCOUNTER — Telehealth: Payer: Self-pay | Admitting: Oncology

## 2013-06-03 ENCOUNTER — Ambulatory Visit (HOSPITAL_BASED_OUTPATIENT_CLINIC_OR_DEPARTMENT_OTHER): Payer: Medicare Other | Admitting: Oncology

## 2013-06-03 ENCOUNTER — Other Ambulatory Visit: Payer: Medicare Other | Admitting: Lab

## 2013-06-03 VITALS — BP 164/55 | HR 42 | Temp 97.2°F | Resp 18 | Ht 71.0 in | Wt 168.7 lb

## 2013-06-03 DIAGNOSIS — C677 Malignant neoplasm of urachus: Secondary | ICD-10-CM

## 2013-06-03 DIAGNOSIS — C679 Malignant neoplasm of bladder, unspecified: Secondary | ICD-10-CM

## 2013-06-03 NOTE — Progress Notes (Signed)
Hematology and Oncology Follow Up Visit  Fitzhugh Vizcarrondo [id number] 1936-11-13 77 y.o. 06/03/2013 8:39 AM [name],[name], [name], [name], MD   Principle Diagnosis: 77 year old with urothelial carcinoma of the bladder, high-grade, although it does not appear to be muscle invasive on the recent specimen.  Prior Therapy: He received 6 treatments of BCG that concluded in November of 2013. He had a repeat biopsy that was  done on 12/23/2012. The case number was [id number] which showed atypical urothelial cells suspicious for malignancy. That was the cytology report. The actual surgical pathology was done on 12/15/2012 and that was case number [id number] and showed urothelial carcinoma high-grade, papillary, noninvasive. The lamina propria is present but without invasion of tumor with question of lymph node involvement.   Current therapy: Neoadjuvant chemotherapy with Cisplatin and Gemzar started on 01/12/13. He completed 6 cycles in 04/2013. He is under consideration for salvage cyclectomy.   Interim History:  Mr Mcneel is seen for routine follow-up today. He completed chemotherapy 3 weeks ago without any complications. He is eating very well now and have regained his taste now. No vomiting noted. Denies chest pain, SOB, DOE. No abdominal pain. States hematuria has now completely resolved. He has not other bleeding. He has mild fatigue, but is still able to perform most activity of daily living. He reports no new complications to cancer therapy. He still able to function well. No neuropathy or any other complications.   Medications: I have reviewed the patient's current medications.   Current Outpatient Prescriptions  Medication Sig Dispense Refill  . amLODipine (NORVASC) 10 MG tablet Take 5 mg by mouth daily.       Marland Kitchen LORazepam (ATIVAN) 0.5 MG tablet Take 1 tablet (0.5 mg total) by mouth every 8 (eight) hours.  30 tablet  1  . temazepam (RESTORIL) 30 MG capsule Take 1 capsule by mouth at bedtime as  needed.       No current facility-administered medications for this visit.    Allergies: No Known Allergies  Past Medical History, Surgical history, Social history, and Family History were reviewed and updated.  Review of Systems: Constitutional:  Negative for fever, chills, night sweats, anorexia, weight loss, pain. Cardiovascular: no chest pain or dyspnea on exertion Respiratory: no cough, shortness of breath, or wheezing Neurological: no TIA or stroke symptoms Dermatological: negative ENT: negative Skin: Negative. Gastrointestinal: no abdominal pain, change in bowel habits, or black or bloody stools Genito-Urinary: no dysuria, trouble voiding, or hematuria Hematological and Lymphatic: negative Breast: negative for breast lumps Musculoskeletal: negative Remaining ROS negative.  Physical Exam: Blood pressure 164/55, pulse 42, temperature 97.2 F (36.2 C), temperature source Oral, resp. rate 18, height 5\' 11"  (1.803 m), weight 168 lb 11.2 oz (76.522 kg). ECOG: 1 General appearance: alert, cooperative and no distress Head: Normocephalic, without obvious abnormality, atraumatic Neck: no adenopathy, no carotid bruit, no JVD, supple, symmetrical, trachea midline and thyroid not enlarged, symmetric, no tenderness/mass/nodules Lymph nodes: Cervical, supraclavicular, and axillary nodes normal. Heart:regular rate and rhythm, S1, S2 normal, no murmur, click, rub or gallop Lung:chest clear, no wheezing, rales, normal symmetric air entry, no tachypnea, retractions or cyanosis Abdomen: soft, non-tender, without masses or organomegaly EXT:no erythema, induration, or nodules   Lab Results: Lab Results  Component Value Date   WBC 2.4* 05/11/2013   HGB 9.1* 05/11/2013   HCT 27.7* 05/11/2013   MCV 94.2 05/11/2013   PLT 176 05/11/2013     Chemistry      Component Value Date/Time  NA 141 05/11/2013 0816   K 4.8 05/11/2013 0816   CL 105 05/11/2013 0816   CO2 25 05/11/2013 0816   BUN 34.8*  05/11/2013 0816   CREATININE 1.8* 05/11/2013 0816      Component Value Date/Time   CALCIUM 9.8 05/11/2013 0816   ALKPHOS 70 05/11/2013 0816   AST 21 05/11/2013 0816   ALT 17 05/11/2013 0816   BILITOT 0.50 05/11/2013 0816     Impression and Plan: This is a 77 year old male with the following issues: 1. Urothelial carcinoma of the bladder, high grade. He is S/P neoadjuvant chemotherapy with Cisplatin and Gemzar completed in 04/2013 with excellent response. He did have a follow up scan on 7/7 at [facility] which I do not have the results for. But it appears that he is scheduled to have his operation (likley cyclectomy and lymph node dissection) on 8/4 or so. I will see him 4-5 weeks after for follow up. He asked my suggestion regarding going through with this operation. I advised him to do it if his scan show no new cancer and all his residual cancer can be removed which I think this is the case at this time. 2. Bradycardia. He is followed by Cardiology. Asymptomatic at this time.  3. Nausea/vomiting prophylaxis. He has Zofran. Compazine, and Ativan at home. Under control now.      Leotta Weingarten 7/10/20148:39 AM

## 2013-06-03 NOTE — Telephone Encounter (Signed)
Gave pt appt for lab and MD on September 2014 , printed AVS

## 2013-06-25 HISTORY — PX: OTHER SURGICAL HISTORY: SHX169

## 2013-07-28 ENCOUNTER — Encounter: Payer: Self-pay | Admitting: *Deleted

## 2013-07-28 ENCOUNTER — Telehealth: Payer: Self-pay | Admitting: *Deleted

## 2013-07-28 NOTE — Telephone Encounter (Signed)
Pt called with request to move MD visit from 09/17 to 09/11. Due to weakness x 1 week. Pt denied fever, chest pain, shortness of breath, no recent falls.Review with MD. Notified pt appt has been changed to 9/11 lab at 3pm, MD at 330pm. POF to scheduling.

## 2013-07-29 ENCOUNTER — Telehealth: Payer: Self-pay | Admitting: Oncology

## 2013-07-29 NOTE — Telephone Encounter (Signed)
PER 9/3 POF MOVED 9/17 LB/FU TO 9/11 @ 3PM AND 330PM. PER POF PT AWARE.

## 2013-08-05 ENCOUNTER — Ambulatory Visit (HOSPITAL_BASED_OUTPATIENT_CLINIC_OR_DEPARTMENT_OTHER): Payer: Medicare Other | Admitting: Oncology

## 2013-08-05 ENCOUNTER — Other Ambulatory Visit: Payer: Self-pay | Admitting: Oncology

## 2013-08-05 ENCOUNTER — Telehealth: Payer: Self-pay | Admitting: Oncology

## 2013-08-05 ENCOUNTER — Other Ambulatory Visit (HOSPITAL_BASED_OUTPATIENT_CLINIC_OR_DEPARTMENT_OTHER): Payer: Medicare Other | Admitting: Lab

## 2013-08-05 VITALS — BP 123/47 | HR 63 | Temp 98.8°F | Resp 18 | Ht 70.0 in | Wt 162.1 lb

## 2013-08-05 DIAGNOSIS — C679 Malignant neoplasm of bladder, unspecified: Secondary | ICD-10-CM

## 2013-08-05 LAB — CBC WITH DIFFERENTIAL/PLATELET
Basophils Absolute: 0 10*3/uL (ref 0.0–0.1)
EOS%: 7.4 % — ABNORMAL HIGH (ref 0.0–7.0)
HCT: 27.7 % — ABNORMAL LOW (ref 38.4–49.9)
HGB: 9.3 g/dL — ABNORMAL LOW (ref 13.0–17.1)
LYMPH%: 23.6 % (ref 14.0–49.0)
MCH: 30.2 pg (ref 27.2–33.4)
MCHC: 33.6 g/dL (ref 32.0–36.0)
MCV: 90 fL (ref 79.3–98.0)
MONO%: 9.1 % (ref 0.0–14.0)
NEUT%: 59.4 % (ref 39.0–75.0)
Platelets: 209 10*3/uL (ref 140–400)

## 2013-08-05 LAB — COMPREHENSIVE METABOLIC PANEL (CC13)
AST: 19 U/L (ref 5–34)
Alkaline Phosphatase: 72 U/L (ref 40–150)
BUN: 54.1 mg/dL — ABNORMAL HIGH (ref 7.0–26.0)
Calcium: 9.5 mg/dL (ref 8.4–10.4)
Chloride: 110 mEq/L — ABNORMAL HIGH (ref 98–109)
Creatinine: 2.2 mg/dL — ABNORMAL HIGH (ref 0.7–1.3)
Total Bilirubin: 0.34 mg/dL (ref 0.20–1.20)

## 2013-08-05 NOTE — Telephone Encounter (Signed)
gv adn printed appt sched and avs for pt for Dec °

## 2013-08-05 NOTE — Progress Notes (Signed)
Hematology and Oncology Follow Up Visit  Michael Ball [id number] Nov 11, 1936 77 y.o. 08/05/2013 3:31 PM [name],[name], [name], [name], MD   Principle Diagnosis: 77 year old with urothelial carcinoma of the bladder, high-grade, although it does not appear to be muscle invasive on the recent specimen.  Prior Therapy: He received 6 treatments of BCG that concluded in November of 2013. He had a repeat biopsy that was  done on 12/23/2012. The case number was [id number] which showed atypical urothelial cells suspicious for malignancy. That was the cytology report. The actual surgical pathology was done on 12/15/2012 and that was case number [id number] and showed urothelial carcinoma high-grade, papillary, noninvasive. The lamina propria is present but without invasion of tumor with question of lymph node involvement.  Neoadjuvant chemotherapy with Cisplatin and Gemzar started on 01/12/13. He completed 6 cycles in 04/2013 He is status post cystoprostatectomy done on 06/28/2013 at [facility]. The margins were negative and that his tumor found to be T1 N2 residual disease.  Current therapy:  Observation and surveillance.  Interim History:  Mr Michael Ball is seen for routine follow-up today with his wife. He completed his surgery on 06/28/2013 without any major complications. He is slowly recovering and have regained some of his activities of daily living. Still rather weak but not short of breath and not reporting any chest pain. He is eating very well now and have regained his taste now. No vomiting noted. Denies chest pain, SOB, DOE. No abdominal pain. States hematuria has now completely resolved. He has not other bleeding.  No neuropathy or any other complications. He is able to ambulate short distances but he asked to stop due to fatigue  Medications: I have reviewed the patient's current medications.   Current Outpatient Prescriptions  Medication Sig Dispense Refill  . amLODipine (NORVASC) 10 MG tablet Take 5  mg by mouth daily.       Marland Kitchen losartan-hydrochlorothiazide (HYZAAR) 100-25 MG per tablet Take 1 tablet by mouth daily.      . temazepam (RESTORIL) 30 MG capsule Take 1 capsule by mouth at bedtime as needed.       No current facility-administered medications for this visit.    Allergies: No Known Allergies  Past Medical History, Surgical history, Social history, and Family History were reviewed and updated.  Review of Systems: Constitutional:  Negative for fever, chills, night sweats, anorexia, weight loss, pain. Cardiovascular: no chest pain or dyspnea on exertion Respiratory: no cough, shortness of breath, or wheezing Neurological: no TIA or stroke symptoms Dermatological: negative ENT: negative Skin: Negative. Gastrointestinal: no abdominal pain, change in bowel habits, or black or bloody stools Genito-Urinary: no dysuria, trouble voiding, or hematuria Hematological and Lymphatic: negative Breast: negative for breast lumps Musculoskeletal: negative Remaining ROS negative.  Physical Exam: Blood pressure 123/47, pulse 63, temperature 98.8 F (37.1 C), temperature source Oral, resp. rate 18, height 5\' 10"  (1.778 m), weight 162 lb 1.6 oz (73.528 kg), SpO2 100.00%. ECOG: 1 General appearance: alert, cooperative and no distress Head: Normocephalic, without obvious abnormality, atraumatic Neck: no adenopathy, no carotid bruit, no JVD, supple, symmetrical, trachea midline and thyroid not enlarged, symmetric, no tenderness/mass/nodules Lymph nodes: Cervical, supraclavicular, and axillary nodes normal. Heart:regular rate and rhythm, S1, S2 normal, no murmur, click, rub or gallop Lung:chest clear, no wheezing, rales, normal symmetric air entry, no tachypnea, retractions or cyanosis Abdomen: soft, non-tender, without masses or organomegaly EXT:no erythema, induration, or nodules   Lab Results: Lab Results  Component Value Date   WBC 8.2  08/05/2013   HGB 9.3* 08/05/2013   HCT 27.7*  08/05/2013   MCV 90.0 08/05/2013   PLT 209 08/05/2013     Chemistry      Component Value Date/Time   NA 141 08/05/2013 1449   K 4.2 08/05/2013 1449   CL 105 05/11/2013 0816   CO2 21* 08/05/2013 1449   BUN 54.1* 08/05/2013 1449   CREATININE 2.2* 08/05/2013 1449      Component Value Date/Time   CALCIUM 9.5 08/05/2013 1449   ALKPHOS 72 08/05/2013 1449   AST 19 08/05/2013 1449   ALT 17 08/05/2013 1449   BILITOT 0.34 08/05/2013 1449     Impression and Plan: This is a 77 year old male with the following issues: 1. Urothelial carcinoma of the bladder, high grade. He is S/P neoadjuvant chemotherapy with Cisplatin and Gemzar completed in 04/2013 with excellent response. He underwent surgical resection with residual tumor of T1 N2 disease and recovering slowly. He will continue to followup at the oncology clinic at [facility] where he will get his surveillance CT scans. 2. Bradycardia. He is followed by Cardiology. Asymptomatic at this time.  3. Anemia: His hemoglobin is rather stable he did require a packed red cell transfusion postoperatively but I do not think he needed at this time. 4. Followup I will see him back in December sooner if needed to.      [facility] 9/11/20143:31 PM

## 2013-08-11 ENCOUNTER — Other Ambulatory Visit: Payer: Medicare Other | Admitting: Lab

## 2013-08-11 ENCOUNTER — Ambulatory Visit: Payer: Medicare Other | Admitting: Oncology

## 2013-10-29 ENCOUNTER — Telehealth: Payer: Self-pay | Admitting: Oncology

## 2013-10-29 NOTE — Telephone Encounter (Signed)
wife called stating cancel 12/17 appts and they did not wish to rs at this time shh

## 2013-11-10 ENCOUNTER — Other Ambulatory Visit: Payer: Medicare Other | Admitting: Lab

## 2013-11-10 ENCOUNTER — Ambulatory Visit: Payer: Medicare Other | Admitting: Oncology

## 2013-12-03 HISTORY — PX: OTHER SURGICAL HISTORY: SHX169

## 2013-12-06 ENCOUNTER — Encounter (HOSPITAL_COMMUNITY): Payer: Self-pay | Admitting: Emergency Medicine

## 2013-12-06 ENCOUNTER — Inpatient Hospital Stay (HOSPITAL_COMMUNITY)
Admission: EM | Admit: 2013-12-06 | Discharge: 2013-12-09 | DRG: 153 | Disposition: A | Discharge status: Home Health | Payer: Medicare Other | Attending: Internal Medicine | Admitting: Internal Medicine

## 2013-12-06 ENCOUNTER — Emergency Department (HOSPITAL_COMMUNITY): Payer: Medicare Other

## 2013-12-06 DIAGNOSIS — IMO0001 Reserved for inherently not codable concepts without codable children: Secondary | ICD-10-CM | POA: Diagnosis present

## 2013-12-06 DIAGNOSIS — N183 Chronic kidney disease, stage 3 unspecified: Secondary | ICD-10-CM | POA: Diagnosis present

## 2013-12-06 DIAGNOSIS — R509 Fever, unspecified: Secondary | ICD-10-CM

## 2013-12-06 DIAGNOSIS — N179 Acute kidney failure, unspecified: Secondary | ICD-10-CM | POA: Diagnosis present

## 2013-12-06 DIAGNOSIS — Z9221 Personal history of antineoplastic chemotherapy: Secondary | ICD-10-CM

## 2013-12-06 DIAGNOSIS — C679 Malignant neoplasm of bladder, unspecified: Secondary | ICD-10-CM

## 2013-12-06 DIAGNOSIS — R0902 Hypoxemia: Secondary | ICD-10-CM | POA: Diagnosis present

## 2013-12-06 DIAGNOSIS — I129 Hypertensive chronic kidney disease with stage 1 through stage 4 chronic kidney disease, or unspecified chronic kidney disease: Secondary | ICD-10-CM | POA: Diagnosis present

## 2013-12-06 DIAGNOSIS — J101 Influenza due to other identified influenza virus with other respiratory manifestations: Secondary | ICD-10-CM

## 2013-12-06 DIAGNOSIS — N189 Chronic kidney disease, unspecified: Secondary | ICD-10-CM

## 2013-12-06 DIAGNOSIS — T814XXA Infection following a procedure, initial encounter: Secondary | ICD-10-CM

## 2013-12-06 DIAGNOSIS — K5900 Constipation, unspecified: Secondary | ICD-10-CM

## 2013-12-06 DIAGNOSIS — R6510 Systemic inflammatory response syndrome (SIRS) of non-infectious origin without acute organ dysfunction: Secondary | ICD-10-CM

## 2013-12-06 DIAGNOSIS — J111 Influenza due to unidentified influenza virus with other respiratory manifestations: Principal | ICD-10-CM | POA: Diagnosis present

## 2013-12-06 DIAGNOSIS — Z79899 Other long term (current) drug therapy: Secondary | ICD-10-CM

## 2013-12-06 DIAGNOSIS — N390 Urinary tract infection, site not specified: Secondary | ICD-10-CM | POA: Diagnosis present

## 2013-12-06 DIAGNOSIS — D649 Anemia, unspecified: Secondary | ICD-10-CM | POA: Diagnosis present

## 2013-12-06 DIAGNOSIS — J40 Bronchitis, not specified as acute or chronic: Secondary | ICD-10-CM

## 2013-12-06 DIAGNOSIS — I10 Essential (primary) hypertension: Secondary | ICD-10-CM

## 2013-12-06 HISTORY — DX: Essential (primary) hypertension: I10

## 2013-12-06 LAB — URINALYSIS, ROUTINE W REFLEX MICROSCOPIC
BILIRUBIN URINE: NEGATIVE
Bilirubin Urine: NEGATIVE
GLUCOSE, UA: NEGATIVE mg/dL
Glucose, UA: NEGATIVE mg/dL
Ketones, ur: NEGATIVE mg/dL
Ketones, ur: NEGATIVE mg/dL
Leukocytes, UA: NEGATIVE
Nitrite: NEGATIVE
Nitrite: NEGATIVE
Protein, ur: 100 mg/dL — AB
Protein, ur: 100 mg/dL — AB
SPECIFIC GRAVITY, URINE: 1.015 (ref 1.005–1.030)
Specific Gravity, Urine: 1.013 (ref 1.005–1.030)
UROBILINOGEN UA: 0.2 mg/dL (ref 0.0–1.0)
Urobilinogen, UA: 0.2 mg/dL (ref 0.0–1.0)
pH: 5.5 (ref 5.0–8.0)
pH: 5.5 (ref 5.0–8.0)

## 2013-12-06 LAB — CBC WITH DIFFERENTIAL/PLATELET
Basophils Absolute: 0 K/uL (ref 0.0–0.1)
Basophils Relative: 0 % (ref 0–1)
Eosinophils Absolute: 0 K/uL (ref 0.0–0.7)
Eosinophils Relative: 0 % (ref 0–5)
HCT: 34.1 % — ABNORMAL LOW (ref 39.0–52.0)
Hemoglobin: 11.6 g/dL — ABNORMAL LOW (ref 13.0–17.0)
Lymphocytes Relative: 6 % — ABNORMAL LOW (ref 12–46)
Lymphs Abs: 0.6 K/uL — ABNORMAL LOW (ref 0.7–4.0)
MCH: 30.1 pg (ref 26.0–34.0)
MCHC: 34 g/dL (ref 30.0–36.0)
MCV: 88.3 fL (ref 78.0–100.0)
Monocytes Absolute: 0.6 K/uL (ref 0.1–1.0)
Monocytes Relative: 6 % (ref 3–12)
Neutro Abs: 9 K/uL — ABNORMAL HIGH (ref 1.7–7.7)
Neutrophils Relative %: 88 % — ABNORMAL HIGH (ref 43–77)
Platelets: 164 K/uL (ref 150–400)
RBC: 3.86 MIL/uL — ABNORMAL LOW (ref 4.22–5.81)
RDW: 14.3 % (ref 11.5–15.5)
WBC Morphology: INCREASED
WBC: 10.2 K/uL (ref 4.0–10.5)

## 2013-12-06 LAB — URINE MICROSCOPIC-ADD ON

## 2013-12-06 LAB — COMPREHENSIVE METABOLIC PANEL
ALBUMIN: 3.9 g/dL (ref 3.5–5.2)
ALK PHOS: 69 U/L (ref 39–117)
ALT: 30 U/L (ref 0–53)
AST: 41 U/L — AB (ref 0–37)
BUN: 35 mg/dL — ABNORMAL HIGH (ref 6–23)
CO2: 20 mEq/L (ref 19–32)
Calcium: 9.3 mg/dL (ref 8.4–10.5)
Chloride: 100 mEq/L (ref 96–112)
Creatinine, Ser: 2.65 mg/dL — ABNORMAL HIGH (ref 0.50–1.35)
GFR calc Af Amer: 26 mL/min — ABNORMAL LOW (ref >90)
GFR calc non Af Amer: 22 mL/min — ABNORMAL LOW (ref >90)
Glucose, Bld: 137 mg/dL — ABNORMAL HIGH (ref 70–99)
POTASSIUM: 3.9 meq/L (ref 3.7–5.3)
Sodium: 138 mEq/L (ref 137–147)
Total Bilirubin: 0.5 mg/dL (ref 0.3–1.2)
Total Protein: 8 g/dL (ref 6.0–8.3)

## 2013-12-06 MED ORDER — ACETAMINOPHEN 500 MG PO TABS
500.0000 mg | ORAL_TABLET | Freq: Once | ORAL | Status: AC
  Administered 2013-12-06: 500 mg via ORAL
  Filled 2013-12-06: qty 1

## 2013-12-06 MED ORDER — VANCOMYCIN HCL IN DEXTROSE 750-5 MG/150ML-% IV SOLN: 750.0000 mg | INTRAVENOUS | Status: DC

## 2013-12-06 MED ORDER — ONDANSETRON HCL 4 MG/2ML IJ SOLN
4.0000 mg | Freq: Once | INTRAMUSCULAR | Status: AC
  Administered 2013-12-06: 4 mg via INTRAVENOUS
  Filled 2013-12-06: qty 2

## 2013-12-06 MED ORDER — PIPERACILLIN-TAZOBACTAM 3.375 G IVPB 30 MIN
3.3750 g | INTRAVENOUS | Status: AC
  Administered 2013-12-06: 3.375 g via INTRAVENOUS
  Filled 2013-12-06: qty 50

## 2013-12-06 MED ORDER — VANCOMYCIN HCL IN DEXTROSE 1-5 GM/200ML-% IV SOLN
1000.0000 mg | INTRAVENOUS | Status: AC
  Administered 2013-12-06: 1000 mg via INTRAVENOUS
  Filled 2013-12-06: qty 200

## 2013-12-06 MED ORDER — PIPERACILLIN-TAZOBACTAM 3.375 G IVPB: 3.3750 g | Freq: Once | INTRAVENOUS | Status: DC

## 2013-12-06 MED ORDER — SODIUM CHLORIDE 0.9 % IV SOLN
INTRAVENOUS | Status: DC
  Administered 2013-12-06: 21:00:00 via INTRAVENOUS

## 2013-12-06 MED ORDER — ACETAMINOPHEN 325 MG PO TABS
650.0000 mg | ORAL_TABLET | Freq: Four times a day (QID) | ORAL | Status: DC | PRN
  Filled 2013-12-06: qty 2

## 2013-12-06 NOTE — ED Notes (Signed)
Pt has had fever, nausea, productive cough x 3 days. 2 days ago pt was given 2 urostomy tubes, for urinary retention. 104.4 temp at home. Lives at home independently.

## 2013-12-06 NOTE — ED Provider Notes (Signed)
CSN: [id number]     Arrival date & time 12/06/13  2007 History   First MD Initiated Contact with Patient 12/06/13 2043     Chief Complaint  Patient presents with  . Flu-like Symptoms    (Consider location/radiation/quality/duration/timing/severity/associated sxs/prior Treatment) HPI Patient has a history of bladder cancer. He reports he used to have a urostomy in his abdomen however his kidneys had stopped draining. They think he has some scar tissue. He was admitted to [facility] 3 days ago and had bilateral percutaneous urostomy tubes placed and was discharged 2 days ago. He reports starting getting nausea 2 days ago that has persisted and gotten worse today. He started getting a sore throat and cough yesterday. He states he's coughing up yellow sputum but he denies feeling short of breath. He started having fever yesterday up to 101.5. He reports chills starting today. He called [facility] yesterday and was started on amoxicillin 500 mg 4 times a day and then Cipro was started today at 500 mg every 18 hours. He denies vomiting or diarrhea. He reports initially  the leg bags had blood in them however the left bag has cleared up. The right was still has some bloody urine drainage. He reports when he was admitted to the hospital his creatinine was up to 3.7 however at discharge it was down to 2.9. He also reports some swelling in his right leg for the past few weeks. He reports is getting better. He states he had bilateral Doppler ultrasounds that were negative. His next appt with [facility] is on the 21st. They are going to consider doing a stent or balloon.   PCP Dr Cathlean [facility] Dr Waynetta Sandy [facility] Dr Redmond Pulling Oncologist Dr Alen Blew  Past Medical History  Diagnosis Date  . Bladder cancer   . Hypertension    No past surgical history on file. No family history on file. History  Substance Use Topics  . Smoking status: Not on file  . Smokeless tobacco: Not on file  . Alcohol Use: Not on file   lives at home Lives with spouse Patient denies smoking Patient denies drinking  Review of Systems  All other systems reviewed and are negative.    Allergies  Review of patient's allergies indicates no known allergies.  Home Medications   Current Outpatient Rx  Name  Route  Sig  Dispense  Refill  . amLODipine (NORVASC) 10 MG tablet   Oral   Take 10 mg by mouth daily.          Marland Kitchen ampicillin (PRINCIPEN) 250 MG capsule   Oral   Take 250 mg by mouth 4 (four) times daily.          . ciprofloxacin (CIPRO) 500 MG tablet   Oral   Take 500 mg by mouth every 18 (eighteen) hours.          . furosemide (LASIX) 20 MG tablet   Oral   Take 20 mg by mouth daily.          Marland Kitchen losartan-hydrochlorothiazide (HYZAAR) 100-25 MG per tablet   Oral   Take 1 tablet by mouth daily.         . ondansetron (ZOFRAN) 4 MG tablet   Oral   Take 4 mg by mouth every 8 (eight) hours as needed for nausea.          Marland Kitchen oxyCODONE-acetaminophen (PERCOCET/ROXICET) 5-325 MG per tablet   Oral   Take 1-2 tablets by mouth every 6 (six) hours  as needed for severe pain.         . polyethylene glycol (MIRALAX / GLYCOLAX) packet   Oral   Take 17 g by mouth daily.         Marland Kitchen senna (SENOKOT) 8.6 MG TABS tablet   Oral   Take 1 tablet by mouth daily as needed for mild constipation.          . temazepam (RESTORIL) 30 MG capsule   Oral   Take 1 capsule by mouth at bedtime as needed for sleep.          . simvastatin (ZOCOR) 20 MG tablet   Oral   Take 20 mg by mouth daily at 6 PM.           BP 134/55  Pulse 102  Temp(Src) 103 F (39.4 C) (Oral)  SpO2 94%  Vital signs normal except for fever, tachycardia  Physical Exam  Nursing note and vitals reviewed. Constitutional: He is oriented to person, place, and time. He appears well-developed and well-nourished.  Non-toxic appearance. He does not appear ill. No distress.  HENT:  Head: Normocephalic and atraumatic.  Right Ear: External ear  normal.  Left Ear: External ear normal.  Nose: Nose normal. No mucosal edema or rhinorrhea.  Mouth/Throat: Oropharynx is clear and moist and mucous membranes are normal. No dental abscesses or uvula swelling.  Eyes: Conjunctivae and EOM are normal. Pupils are equal, round, and reactive to light.  Neck: Normal range of motion and full passive range of motion without pain. Neck supple.  Cardiovascular: Normal rate, regular rhythm and normal heart sounds.  Exam reveals no gallop and no friction rub.   No murmur heard. Pulmonary/Chest: Effort normal and breath sounds normal. No respiratory distress. He has no wheezes. He has no rhonchi. He has no rales. He exhibits no tenderness and no crepitus.  Abdominal: Soft. Normal appearance and bowel sounds are normal. He exhibits no distension. There is no tenderness. There is no rebound and no guarding.  Genitourinary:  Patient has bilateral leg bags. The right bag is grossly bloody with some clear urine in the tube. The left bag is yellow without obvious blood. He has bilateral urostomy tubes in his lower flanks. The gauze are not bloodsoaked. However he does have a small amount of blood on his shirt top.  Musculoskeletal: Normal range of motion. He exhibits no edema and no tenderness.  Moves all extremities well.   Neurological: He is alert and oriented to person, place, and time. He has normal strength. No cranial nerve deficit.  Skin: Skin is warm, dry and intact. No rash noted. No erythema. No pallor.  Psychiatric: His speech is normal and behavior is normal. His mood appears not anxious.  Flat affect    ED Course  Procedures (including critical care time)  Medications  0.9 %  sodium chloride infusion ( Intravenous New Bag/Given 12/06/13 2100)  vancomycin (VANCOCIN) IVPB 1000 mg/200 mL premix (1,000 mg Intravenous New Bag/Given 12/06/13 2324)  vancomycin (VANCOCIN) IVPB 750 mg/150 ml premix (not administered)  acetaminophen (TYLENOL) tablet 500 mg  (500 mg Oral Given 12/06/13 2049)  ondansetron (ZOFRAN) injection 4 mg (4 mg Intravenous Given 12/06/13 2107)  piperacillin-tazobactam (ZOSYN) IVPB 3.375 g (0 g Intravenous Stopped 12/06/13 2324)    Urine sample was sent from each of his kidneys since he has bilateral percutaneous urostomies.    Pt started on antibiotics for possible bacterial infection. Since patient is being admitted, flu test was sent.  Pt states he wants to be admitted here instead of [facility].   23:38 Dr Maryland Pink, admit to med-surg, observation, team 8  Labs Review Results for orders placed during the hospital encounter of 12/06/13  CBC WITH DIFFERENTIAL      Result Value Range   WBC 10.2  4.0 - 10.5 K/uL   RBC 3.86 (*) 4.22 - 5.81 MIL/uL   Hemoglobin 11.6 (*) 13.0 - 17.0 g/dL   HCT 34.1 (*) 39.0 - 52.0 %   MCV 88.3  78.0 - 100.0 fL   MCH 30.1  26.0 - 34.0 pg   MCHC 34.0  30.0 - 36.0 g/dL   RDW 14.3  11.5 - 15.5 %   Platelets 164  150 - 400 K/uL   Neutrophils Relative % 88 (*) 43 - 77 %   Lymphocytes Relative 6 (*) 12 - 46 %   Monocytes Relative 6  3 - 12 %   Eosinophils Relative 0  0 - 5 %   Basophils Relative 0  0 - 1 %   Neutro Abs 9.0 (*) 1.7 - 7.7 K/uL   Lymphs Abs 0.6 (*) 0.7 - 4.0 K/uL   Monocytes Absolute 0.6  0.1 - 1.0 K/uL   Eosinophils Absolute 0.0  0.0 - 0.7 K/uL   Basophils Absolute 0.0  0.0 - 0.1 K/uL   WBC Morphology INCREASED BANDS (>20% BANDS)    COMPREHENSIVE METABOLIC PANEL      Result Value Range   Sodium 138  137 - 147 mEq/L   Potassium 3.9  3.7 - 5.3 mEq/L   Chloride 100  96 - 112 mEq/L   CO2 20  19 - 32 mEq/L   Glucose, Bld 137 (*) 70 - 99 mg/dL   BUN 35 (*) 6 - 23 mg/dL   Creatinine, Ser 2.65 (*) 0.50 - 1.35 mg/dL   Calcium 9.3  8.4 - 10.5 mg/dL   Total Protein 8.0  6.0 - 8.3 g/dL   Albumin 3.9  3.5 - 5.2 g/dL   AST 41 (*) 0 - 37 U/L   ALT 30  0 - 53 U/L   Alkaline Phosphatase 69  39 - 117 U/L   Total Bilirubin 0.5  0.3 - 1.2 mg/dL   GFR calc non Af Amer 22 (*) >90 mL/min    GFR calc Af Amer 26 (*) >90 mL/min  URINALYSIS, ROUTINE W REFLEX MICROSCOPIC      Result Value Range   Color, Urine RED (*) YELLOW   APPearance CLOUDY (*) CLEAR   Specific Gravity, Urine 1.015  1.005 - 1.030   pH 5.5  5.0 - 8.0   Glucose, UA NEGATIVE  NEGATIVE mg/dL   Hgb urine dipstick LARGE (*) NEGATIVE   Bilirubin Urine NEGATIVE  NEGATIVE   Ketones, ur NEGATIVE  NEGATIVE mg/dL   Protein, ur 100 (*) NEGATIVE mg/dL   Urobilinogen, UA 0.2  0.0 - 1.0 mg/dL   Nitrite NEGATIVE  NEGATIVE   Leukocytes, UA SMALL (*) NEGATIVE  URINALYSIS, ROUTINE W REFLEX MICROSCOPIC      Result Value Range   Color, Urine YELLOW  YELLOW   APPearance CLOUDY (*) CLEAR   Specific Gravity, Urine 1.013  1.005 - 1.030   pH 5.5  5.0 - 8.0   Glucose, UA NEGATIVE  NEGATIVE mg/dL   Hgb urine dipstick LARGE (*) NEGATIVE   Bilirubin Urine NEGATIVE  NEGATIVE   Ketones, ur NEGATIVE  NEGATIVE mg/dL   Protein, ur 100 (*) NEGATIVE mg/dL   Urobilinogen, UA 0.2  0.0 - 1.0 mg/dL   Nitrite NEGATIVE  NEGATIVE   Leukocytes, UA NEGATIVE  NEGATIVE  URINE MICROSCOPIC-ADD ON      Result Value Range   WBC, UA 3-6  <3 WBC/hpf   RBC / HPF TOO NUMEROUS TO COUNT  <3 RBC/hpf   Bacteria, UA FEW (*) RARE   Casts HYALINE CASTS (*) NEGATIVE  URINE MICROSCOPIC-ADD ON      Result Value Range   WBC, UA 0-2  <3 WBC/hpf   RBC / HPF 21-50  <3 RBC/hpf   Bacteria, UA RARE  RARE   Casts HYALINE CASTS (*) NEGATIVE   Laboratory interpretation all normal except renal insuffic, leukocytosis, anemia     Imaging Review Dg Chest 2 View  12/06/2013   CLINICAL DATA:  Cough and fever  EXAM: CHEST  2 VIEW  COMPARISON:  None.  FINDINGS: Mild cardiomegaly. Normal vascularity. Small left pleural effusion. Clear lungs. No pneumothorax.  IMPRESSION: Cardiomegaly without edema probe  Small left pleural effusion.   Electronically Signed   By: Maryclare Bean M.D.   On: 12/06/2013 21:55    EKG Interpretation   None       MDM   1. Fever   2.  Bronchitis   3. Post op infection, initial encounter     Plan admission   Rolland Porter, MD, Alanson Aly, MD 12/07/13 [phone]

## 2013-12-06 NOTE — ED Notes (Signed)
Pt has hx of bladder CA with removal of bladder. Pt has a urinary conduit and had bilateral urostomy bags placed Friday due to a kidney blockage. Family and pt report R bag has had more output and it is more red. Pt took 500mg  tylenol at 315pm today. Temp 104 at home, 48 F here. Pt states he is not in pain but feels confused.

## 2013-12-06 NOTE — ED Notes (Signed)
Bed: WA04 Expected date:  Expected time:  Means of arrival:  Comments: EMS/77 yo male with flu like symptoms

## 2013-12-06 NOTE — Progress Notes (Signed)
ANTIBIOTIC CONSULT NOTE - INITIAL  Pharmacy Consult for Vancomycin Indication: Bacteremia  No Known Allergies  Patient Measurements: Height: 5\' 11"  (180.3 cm) Weight: 175 lb (79.379 kg) IBW/kg (Calculated) : 75.3  Vital Signs: Temp: 103 F (39.4 C) (01/12 2016) Temp src: Oral (01/12 2016) BP: 134/55 mmHg (01/12 2016) Pulse Rate: 102 (01/12 2016) Intake/Output from previous day:   Intake/Output from this shift:    Labs:  Recent Labs  12/06/13 2102  WBC 10.2  HGB 11.6*  PLT 164  CREATININE 2.65*   Estimated Creatinine Clearance: 25.7 ml/min (by C-G formula based on Cr of 2.65). No results found for this basename: VANCOTROUGH, VANCOPEAK, VANCORANDOM, GENTTROUGH, GENTPEAK, GENTRANDOM, TOBRATROUGH, TOBRAPEAK, TOBRARND, AMIKACINPEAK, AMIKACINTROU, AMIKACIN,  in the last 72 hours   Microbiology: No results found for this or any previous visit (from the past 720 hour(s)).  Medical History: Past Medical History  Diagnosis Date  . Bladder cancer   . Hypertension     Medications:  Scheduled:   Infusions:  . sodium chloride 100 mL/hr at 12/06/13 2100  . piperacillin-tazobactam    . vancomycin     Assessment:  78 yr male with h/o bladder cancer with removal of bladder.  Recent kidney blockage and urostomy bags placed Friday.  Comes to ED with complaint of fever, cough, chills and nausea  Zosyn x 1 ordered in ED  Vancomycin per pharmacy ordered for bacteremia  Blood and urine cultures ordered  CrCl (n) ~ 24 ml/min  Goal of Therapy:  Vancomycin trough level 15-20 mcg/ml  Plan:  Measure antibiotic drug levels at steady state Follow up culture results Vancomycin 1gm IV x 1 followed by 750mg  IV q24h  Sajid Ruppert, Toribio Harbour, PharmD 12/06/2013,10:20 PM

## 2013-12-07 ENCOUNTER — Encounter (HOSPITAL_COMMUNITY): Payer: Self-pay | Admitting: Emergency Medicine

## 2013-12-07 DIAGNOSIS — J101 Influenza due to other identified influenza virus with other respiratory manifestations: Secondary | ICD-10-CM | POA: Diagnosis present

## 2013-12-07 DIAGNOSIS — R6510 Systemic inflammatory response syndrome (SIRS) of non-infectious origin without acute organ dysfunction: Secondary | ICD-10-CM | POA: Diagnosis present

## 2013-12-07 DIAGNOSIS — J111 Influenza due to unidentified influenza virus with other respiratory manifestations: Principal | ICD-10-CM

## 2013-12-07 DIAGNOSIS — J40 Bronchitis, not specified as acute or chronic: Secondary | ICD-10-CM

## 2013-12-07 DIAGNOSIS — I10 Essential (primary) hypertension: Secondary | ICD-10-CM | POA: Diagnosis present

## 2013-12-07 DIAGNOSIS — K5900 Constipation, unspecified: Secondary | ICD-10-CM | POA: Diagnosis present

## 2013-12-07 DIAGNOSIS — N179 Acute kidney failure, unspecified: Secondary | ICD-10-CM

## 2013-12-07 DIAGNOSIS — N189 Chronic kidney disease, unspecified: Secondary | ICD-10-CM

## 2013-12-07 DIAGNOSIS — R509 Fever, unspecified: Secondary | ICD-10-CM

## 2013-12-07 LAB — BASIC METABOLIC PANEL
BUN: 33 mg/dL — ABNORMAL HIGH (ref 6–23)
CALCIUM: 8.2 mg/dL — AB (ref 8.4–10.5)
CO2: 20 mEq/L (ref 19–32)
CREATININE: 2.61 mg/dL — AB (ref 0.50–1.35)
Chloride: 103 mEq/L (ref 96–112)
GFR calc non Af Amer: 22 mL/min — ABNORMAL LOW (ref >90)
GFR, EST AFRICAN AMERICAN: 26 mL/min — AB (ref >90)
Glucose, Bld: 126 mg/dL — ABNORMAL HIGH (ref 70–99)
Potassium: 3.5 mEq/L — ABNORMAL LOW (ref 3.7–5.3)
Sodium: 138 mEq/L (ref 137–147)

## 2013-12-07 LAB — CBC
HCT: 26.3 % — ABNORMAL LOW (ref 39.0–52.0)
Hemoglobin: 9.2 g/dL — ABNORMAL LOW (ref 13.0–17.0)
MCH: 30.7 pg (ref 26.0–34.0)
MCHC: 35 g/dL (ref 30.0–36.0)
MCV: 87.7 fL (ref 78.0–100.0)
PLATELETS: 144 10*3/uL — AB (ref 150–400)
RBC: 3 MIL/uL — ABNORMAL LOW (ref 4.22–5.81)
RDW: 14.3 % (ref 11.5–15.5)
WBC: 10.9 10*3/uL — ABNORMAL HIGH (ref 4.0–10.5)

## 2013-12-07 LAB — INFLUENZA PANEL BY PCR (TYPE A & B)
H1N1FLUPCR: NOT DETECTED
Influenza A By PCR: POSITIVE — AB
Influenza B By PCR: NEGATIVE

## 2013-12-07 MED ORDER — TEMAZEPAM 15 MG PO CAPS
30.0000 mg | ORAL_CAPSULE | Freq: Every evening | ORAL | Status: DC | PRN
  Administered 2013-12-07 – 2013-12-08 (×2): 30 mg via ORAL
  Filled 2013-12-07 (×2): qty 2

## 2013-12-07 MED ORDER — SODIUM CHLORIDE 0.9 % IV SOLN
INTRAVENOUS | Status: DC
  Administered 2013-12-07 – 2013-12-08 (×2): via INTRAVENOUS

## 2013-12-07 MED ORDER — SENNA 8.6 MG PO TABS
2.0000 | ORAL_TABLET | Freq: Every day | ORAL | Status: DC
  Administered 2013-12-09: 17.2 mg via ORAL
  Filled 2013-12-07 (×2): qty 2

## 2013-12-07 MED ORDER — ALBUTEROL SULFATE (2.5 MG/3ML) 0.083% IN NEBU
2.5000 mg | INHALATION_SOLUTION | RESPIRATORY_TRACT | Status: DC | PRN
  Administered 2013-12-07: 2.5 mg via RESPIRATORY_TRACT
  Filled 2013-12-07 (×2): qty 3

## 2013-12-07 MED ORDER — AMPICILLIN 250 MG PO CAPS
250.0000 mg | ORAL_CAPSULE | Freq: Four times a day (QID) | ORAL | Status: DC
  Administered 2013-12-07: 250 mg via ORAL
  Filled 2013-12-07 (×4): qty 1

## 2013-12-07 MED ORDER — FLEET ENEMA 7-19 GM/118ML RE ENEM: 1.0000 | ENEMA | Freq: Once | RECTAL | Status: DC

## 2013-12-07 MED ORDER — ONDANSETRON HCL 4 MG PO TABS: 4.0000 mg | ORAL_TABLET | Freq: Four times a day (QID) | ORAL | Status: DC | PRN

## 2013-12-07 MED ORDER — AMLODIPINE BESYLATE 10 MG PO TABS
10.0000 mg | ORAL_TABLET | Freq: Every day | ORAL | Status: DC
  Administered 2013-12-07 – 2013-12-09 (×3): 10 mg via ORAL
  Filled 2013-12-07 (×3): qty 1

## 2013-12-07 MED ORDER — LEVOFLOXACIN IN D5W 500 MG/100ML IV SOLN: 500.0000 mg | INTRAVENOUS | Status: DC

## 2013-12-07 MED ORDER — SIMVASTATIN 20 MG PO TABS
20.0000 mg | ORAL_TABLET | Freq: Every day | ORAL | Status: DC
  Administered 2013-12-08: 20 mg via ORAL
  Filled 2013-12-07 (×3): qty 1

## 2013-12-07 MED ORDER — POLYETHYLENE GLYCOL 3350 17 G PO PACK
17.0000 g | PACK | Freq: Every day | ORAL | Status: DC
  Administered 2013-12-07 – 2013-12-09 (×2): 17 g via ORAL
  Filled 2013-12-07 (×3): qty 1

## 2013-12-07 MED ORDER — MORPHINE SULFATE 2 MG/ML IJ SOLN: 1.0000 mg | INTRAMUSCULAR | Status: DC | PRN

## 2013-12-07 MED ORDER — OXYCODONE-ACETAMINOPHEN 5-325 MG PO TABS
1.0000 | ORAL_TABLET | Freq: Four times a day (QID) | ORAL | Status: DC | PRN
  Administered 2013-12-07: 2 via ORAL
  Filled 2013-12-07: qty 2

## 2013-12-07 MED ORDER — LEVOFLOXACIN IN D5W 750 MG/150ML IV SOLN
750.0000 mg | Freq: Once | INTRAVENOUS | Status: AC
  Administered 2013-12-07: 750 mg via INTRAVENOUS
  Filled 2013-12-07: qty 150

## 2013-12-07 MED ORDER — PIPERACILLIN-TAZOBACTAM 3.375 G IVPB
3.3750 g | Freq: Three times a day (TID) | INTRAVENOUS | Status: DC
  Administered 2013-12-07 – 2013-12-08 (×3): 3.375 g via INTRAVENOUS
  Filled 2013-12-07 (×4): qty 50

## 2013-12-07 MED ORDER — OSELTAMIVIR PHOSPHATE 30 MG PO CAPS
30.0000 mg | ORAL_CAPSULE | Freq: Every day | ORAL | Status: DC
  Administered 2013-12-07 – 2013-12-09 (×3): 30 mg via ORAL
  Filled 2013-12-07 (×3): qty 1

## 2013-12-07 MED ORDER — ACETAMINOPHEN 325 MG PO TABS
650.0000 mg | ORAL_TABLET | ORAL | Status: DC | PRN
  Administered 2013-12-07: 650 mg via ORAL
  Filled 2013-12-07: qty 2

## 2013-12-07 MED ORDER — DOCUSATE SODIUM 100 MG PO CAPS
100.0000 mg | ORAL_CAPSULE | Freq: Two times a day (BID) | ORAL | Status: DC
  Administered 2013-12-07 – 2013-12-09 (×5): 100 mg via ORAL
  Filled 2013-12-07 (×6): qty 1

## 2013-12-07 MED ORDER — ONDANSETRON HCL 4 MG/2ML IJ SOLN: 4.0000 mg | Freq: Four times a day (QID) | INTRAMUSCULAR | Status: DC | PRN

## 2013-12-07 NOTE — Evaluation (Signed)
Physical Therapy Evaluation Patient Details Name: Michael Ball MRN: [id number] DOB: 1936/11/13 Today's Date: 12/07/2013 Time: 2409-7353 PT Time Calculation (min): 23 min  PT Assessment / Plan / Recommendation History of Present Illness  With past oral history of bladder cancer status post placement of bilateral percutaneous urostomy tubes 2 days ago at [facility], who had been doing well initially with proper drainage who presented after 24 hours of weakness, fevers and cough with yellowish sputum intermittently. No vomiting or diarrhea. Patient initially was started on amoxicillin 500 mg 4 times a day by [facility] and then Cipro 500 every 18 hours. Discontinue weakness, he was brought into the emergency room here this evening. In emergency room, patient was noted to have a BUN of 35 with creatinine of 2.6 which patient states is better and his lab work at [facility], however creatinine was up from 2.2 in September 2014. Patient's white blood cell count is normal at 10.2 but with an 88% shift. Chest x-ray noted cardiomegaly, but no signs of infiltrate. Flu PCR was positive..   Clinical Impression  Pt is very weak but is able to ambulate. Pt was very independent prior to surgery last week. Pt will benefit from PT to address problems listed.    PT Assessment  Patient needs continued PT services    Follow Up Recommendations  Home health PT    Does the patient have the potential to tolerate intense rehabilitation      Barriers to Discharge        Equipment Recommendations  None recommended by PT    Recommendations for Other Services     Frequency Min 3X/week    Precautions / Restrictions Precautions Precautions: Fall Precaution Comments: bilateral nephrostomy drains, droplet.   Pertinent Vitals/Pain Sore throat.      Mobility  Bed Mobility Overal bed mobility: Needs Assistance Bed Mobility: Supine to Sit;Sit to Supine Supine to sit: Supervision Sit to supine: Supervision General bed mobility  comments: assistance for safety with managing bil. drains, Transfers Overall transfer level: Needs assistance Equipment used: 1 person hand held assist Transfers: Sit to/from Stand Sit to Stand: Min assist General transfer comment: pt leaned against the stretcher to stand up. Ambulation/Gait Ambulation/Gait assistance: Min assist Ambulation Distance (Feet): 20 Feet Assistive device: 1 person hand held assist Gait Pattern/deviations: WFL(Within Functional Limits) General Gait Details: pt is weak and  unsteady upon standing up    Exercises     PT Diagnosis: Difficulty walking;Generalized weakness  PT Problem List: Decreased strength;Decreased activity tolerance;Decreased balance;Decreased knowledge of precautions PT Treatment Interventions: DME instruction;Gait training;Functional mobility training;Therapeutic activities;Therapeutic exercise;Patient/family education     PT Goals(Current goals can be found in the care plan section) Acute Rehab PT Goals Patient Stated Goal: I want to get back to myself. PT Goal Formulation: With patient Time For Goal Achievement: 12/21/13 Potential to Achieve Goals: Good  Visit Information  Last PT Received On: 12/07/13 Assistance Needed: +1 History of Present Illness: With past oral history of bladder cancer status post placement of bilateral percutaneous urostomy tubes 2 days ago at [facility], who had been doing well initially with proper drainage who presented after 24 hours of weakness, fevers and cough with yellowish sputum intermittently. No vomiting or diarrhea. Patient initially was started on amoxicillin 500 mg 4 times a day by [facility] and then Cipro 500 every 18 hours. Discontinue weakness, he was brought into the emergency room here this evening. In emergency room, patient was noted to have a BUN of 35 with creatinine of  2.6 which patient states is better and his lab work at [facility], however creatinine was up from 2.2 in September 2014. Patient's white blood  cell count is normal at 10.2 but with an 88% shift. Chest x-ray noted cardiomegaly, but no signs of infiltrate. Flu PCR was positive..        Prior Functioning  Home Living Family/patient expects to be discharged to:: Private residence Living Arrangements: Spouse/significant other Available Help at Discharge: Family;Available PRN/intermittently Type of Home: House Home Access: Stairs to enter [employer] of Steps: 2-3 Home Layout: One level Home Equipment: None Prior Function Level of Independence: Independent Communication Communication: No difficulties    Cognition  Cognition Arousal/Alertness: Awake/alert Behavior During Therapy: WFL for tasks assessed/performed Overall Cognitive Status: Within Functional Limits for tasks assessed    Extremity/Trunk Assessment Upper Extremity Assessment Upper Extremity Assessment: Generalized weakness Lower Extremity Assessment Lower Extremity Assessment: Generalized weakness   Balance Balance Overall balance assessment: Needs assistance Standing balance support: No upper extremity supported Standing balance-Leahy Scale: Fair Standing balance comment: leans posteriorly.  End of Session PT - End of Session Activity Tolerance: Patient limited by fatigue Patient left: in bed Nurse Communication: Mobility status  GP Functional Assessment Tool Used: clinical judgement Functional Limitation: Mobility: Walking and moving around Mobility: Walking and Moving Around Current Status ([id number]): At least 20 percent but less than 40 percent impaired, limited or restricted Mobility: Walking and Moving Around Goal Status [phone]): 0 percent impaired, limited or restricted   Claretha Cooper 12/07/2013, 12:57 PM Tresa Endo PT [phone]

## 2013-12-07 NOTE — ED Notes (Signed)
Patient reports needing something for his throat pain and for his congestion.  MD paged to get order for breathing treatment.  Percocet given for throat pain.

## 2013-12-07 NOTE — Progress Notes (Signed)
Ma give any information to daughter Angela Nevin and wife Joaquim Lai.   Daughter concerned about meds given to her dad and the reaction it may have with his kidneys

## 2013-12-07 NOTE — Progress Notes (Signed)
ANTIBIOTIC CONSULT NOTE - INITIAL  Pharmacy Consult for Levaquin Indication: Bronchitis  No Known Allergies  Patient Measurements: Height: 5\' 11"  (180.3 cm) Weight: 175 lb (79.379 kg) IBW/kg (Calculated) : 75.3  Vital Signs: Temp: 103 F (39.4 C) (01/12 2016) Temp src: Oral (01/12 2016) BP: 134/55 mmHg (01/12 2016) Pulse Rate: 102 (01/12 2016) Intake/Output from previous day: 01/12 0701 - 01/13 0700 In: -  Out: 125 [Urine:125] Intake/Output from this shift: Total I/O In: -  Out: 125 [Urine:125]  Labs:  Recent Labs  12/06/13 2102  WBC 10.2  HGB 11.6*  PLT 164  CREATININE 2.65*   Estimated Creatinine Clearance: 25.7 ml/min (by C-G formula based on Cr of 2.65). No results found for this basename: VANCOTROUGH, VANCOPEAK, VANCORANDOM, GENTTROUGH, GENTPEAK, GENTRANDOM, TOBRATROUGH, TOBRAPEAK, TOBRARND, AMIKACINPEAK, AMIKACINTROU, AMIKACIN,  in the last 72 hours   Microbiology: No results found for this or any previous visit (from the past 720 hour(s)).  Medical History: Past Medical History  Diagnosis Date  . Bladder cancer   . Hypertension     Medications:  Scheduled:  . amLODipine  10 mg Oral Daily  . ampicillin  250 mg Oral QID  . polyethylene glycol  17 g Oral Daily  . simvastatin  20 mg Oral q1800  . sodium phosphate  1 enema Rectal Once   Infusions:  . sodium chloride 100 mL/hr at 12/06/13 2100  . sodium chloride     Assessment: 78 yr male with h/o bladder cancer with removal of bladder. Recent kidney blockage and urostomy bags placed Friday.  Comes to ED with complaint of fever, cough, chills and nausea.  Zosyn 3.375gm received in ED @ 22:35 on 1/12.  Vancomycin originally ordered by ED physician for bacteremia.  Received Vanc 1gm dose in Ed @  23:24 on 1/12. Pharmacy asked to dose Levaquin for bronchitis.  Received telephone verbal order from Dr Maryland Pink to d/c Vancomycin and to continue ampicillin as prescribed prior to admission. Blood and urine  cultures ordered  CrCl (n) ~ 24 ml/min  Goal of Therapy:  Treatment of infection  Plan:  Levaquin 750mg  IV x 1 followed by 500mg  IV q48h  Alvy Alsop, Toribio Harbour, PharmD 12/07/2013,1:51 AM

## 2013-12-07 NOTE — Progress Notes (Signed)
Received from ED via stretcher, alert and oriented, IV in progress NS at 55ml/hour.  No complaints of pain at this time, Mid abdomen urostomy, bilateral nephrostomy drains/collection bags present, left draining more than right.  Oriented patient to room, ordering meals, phone use, call light, and to call for staff to assist out of bed.  Patient declined fleet enema at this time, states he took laxative at home before coming to hospital.

## 2013-12-07 NOTE — Progress Notes (Signed)
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 He completed neoadjuvant chemotherapy with 6 cycles of GC completed 05/11/2013 and subsequently underwent a robotic radical cystoprostatectomy with ileal conduit and bilateral pelvic lymph node dissection on 06/28/2013 @[facility]   (Dr. Tamala Julian) with pathology revealing pT1-pN2 UC with incidental pT2a Gleason 4 prostate cancer.  He reports that 3 weeks ago he began to develop intermittent "stomach ache", constant nausea and but only one episode of vomiting 3 weeks ago. Occasional loose/watery stools. No BRB, black stool. No fevers. No flank pain. He is eating and taking in fluid. Not using any medication for the pain. No dizziness. He has developed LE edema R>L over the past three weeks. He was started on lasix by PCP but has not started. No shortness of breath, chest pain, palpitations. He also reports worsening fatigue over the past month. A CT scan was performed on 12/02/13@[facility]  demonstrated worse bilateral hydroureteronephrosis and his creatinine was elevated to 3.3 from 1.7 on 09/27/13.  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  UA on 12/02/2013 at [facility] did not show any pyuria.  Patient presented to [facility] with 2 days of fever up to 104.0 at home.  He also c/o lethargy, nausea, myalgia, and cough with white sputume without cp, sob or hemoptysis.  His abdominal pain is improved since the nephrostomy tube placement. He denies any diarrhea, hematochezia, melena.  Patient was started levofloxacin and continued on ampicillin.  Blood cultures and urine cultures were  obtained.   Assessment/Plan: Fever with SIRS criteria -likely due to influenza -start empiric zosyn given recent hospitalization and follow cultures -d/c ampicillin while on zosyn -UA at [facility] on 12/02/13 and on 12/06/13 negative for pyuria -follow blood cultures Influenza -start Tamiflu -droplet isolation Acute on chronic renal failure (CKD3) -Baseline creatinine 1.5-1.7 -Hold lisinopril and ARB -Hold furosemide -gentle hydration Urothelial carcinoma of the bladder -Status post ileoconduit 06/28/2013 @[facility]  -Status post bilateral nephrostomy tubes 12/03/2013 for bilateral ureteral obstruction -last chemo in June 2014 Hypertension -continue amlodipine -d/c losartan and lasix Constipation -start cathartics Leg edema -12/03/13 venous duplex @ [facility] was negative for DVT -may be partly due to amlodipine -albumin 3.9 Hypoxemia -likely due to influenza with pulm manifestations -CXR negative for infiltrates -supplemental oxygen -aerosolized albuterol -continue levofloxacin for now to cover organisms of atypical pneumonia -urine legionella antigen -low suspicion of PE given clinical presentation and neg leg duplex  Family Communication:   Pt at beside Disposition Plan:   Home when medically stable   Antibiotics:  Levofloxacin 12/06/2013>>>  Tamiflu 12/07/2013>>>  Zosyn 12/07/2013>>>    Procedures/Studies: Dg Chest 2 View  12/06/2013   CLINICAL DATA:  Cough and fever  EXAM: CHEST  2 VIEW  COMPARISON:  None.  FINDINGS: Mild cardiomegaly. Normal vascularity. Small left pleural effusion. Clear lungs. No pneumothorax.  IMPRESSION: Cardiomegaly without edema probe  Small left pleural effusion.   Electronically Signed   By: Maryclare Bean M.D.   On: 12/06/2013 21:55         Subjective: Patient complains of chest congestion without any overt shortness of breath, hemoptysis, vomiting, diarrhea, abdominal pain. There is a feeling better. No chest discomfort.  No dizziness or  headache.  Objective: Filed Vitals:   12/06/13 2212 12/07/13 0110 12/07/13 0546 12/07/13 0600  BP:  102/50 142/62   Pulse:  71 82   Temp:  100.1 F (37.8 C) 102.5 F (39.2 C)   TempSrc:  Oral Oral   Resp:   20   Height: 5\' 11"  (1.803 m)     Weight: 79.379 kg (175 lb)     SpO2:  94% 89% 92%    Intake/Output Summary (Last 24 hours) at 12/07/13 0707 Last data filed at 12/07/13 0602  Gross per 24 hour  Intake      0 ml  Output    850 ml  Net   -850 ml   Weight change:  Exam:   General:  Pt is alert, follows commands appropriately, not in acute distress  HEENT: No icterus, No thrush,[place]/AT  Cardiovascular: RRR, S1/S2, no rubs, no gallops  Respiratory: Bilateral rales, right greater than left. No wheezing. Good air movement.  Abdomen: Soft/+BS, non tender, non distended, no guarding; ostomy bag present in the right lower quadrant  Extremities: 1+ R>L edema, No lymphangitis, No petechiae, No rashes, no synovitis  Data Reviewed: Basic Metabolic Panel:  Recent Labs Lab 12/06/13 2102 12/07/13 0505  NA 138 138  K 3.9 3.5*  CL 100 103  CO2 20 20  GLUCOSE 137* 126*  BUN 35* 33*  CREATININE 2.65* 2.61*  CALCIUM 9.3 8.2*   Liver Function Tests:  Recent Labs Lab 12/06/13 2102  AST 41*  ALT 30  ALKPHOS 69  BILITOT 0.5  PROT 8.0  ALBUMIN 3.9   No results found for this basename: LIPASE, AMYLASE,  in the last 168 hours No results found for this basename: AMMONIA,  in the last 168 hours CBC:  Recent Labs Lab 12/06/13 2102 12/07/13 0505  WBC 10.2 10.9*  NEUTROABS 9.0*  --   HGB 11.6* 9.2*  HCT 34.1* 26.3*  MCV 88.3 87.7  PLT 164 144*   Cardiac Enzymes: No results found for this basename: CKTOTAL, CKMB, CKMBINDEX, TROPONINI,  in the last 168 hours BNP: No components found with this basename: POCBNP,  CBG: No results found for this basename: GLUCAP,  in the last 168 hours  No results found for this or any previous visit (from the past 240 hour(s)).    Scheduled Meds: . amLODipine  10 mg Oral Daily  . ampicillin  250 mg Oral QID  . polyethylene glycol  17 g Oral Daily  . simvastatin  20 mg Oral q1800  . sodium phosphate  1 enema Rectal Once   Continuous Infusions: . sodium chloride 100 mL/hr at 12/06/13 2100  . sodium chloride 50 mL/hr at 12/07/13 0215  . [START ON 12/09/2013] levofloxacin (LEVAQUIN) IV    . levofloxacin (LEVAQUIN) IV 750 mg (12/07/13 0543)     Treyson Axel, DO  [facility] Pager [phone]  If 7PM-7AM, please contact night-coverage [email or web address] Password [email or web address] 12/07/2013, 7:07 AM   LOS: 1 day

## 2013-12-07 NOTE — H&P (Signed)
[facility] History and Physical  Michael Ball WUJ:[id number] DOB: [birth year] DOA: 12/06/2013  Referring physician: Rolland Porter, ER physician PCP: Leamon Arnt, MD   Chief Complaint: Fever and weakness  HPI: Michael Ball is a 78 y.o. male  With past oral history of bladder cancer status post placement of bilateral percutaneous urostomy tubes 2 days ago at [facility], who had been doing well initially with proper drainage who presented after 24 hours of weakness, fevers and cough with yellowish sputum intermittently. No vomiting or diarrhea. Patient initially was started on amoxicillin 500 mg 4 times a day by [facility] and then Cipro 500 every 18 hours. Discontinue weakness, he was brought into the emergency room here this evening. In emergency room, patient was noted to have a BUN of 35 with creatinine of 2.6 which patient states is better and his lab work at [facility], however creatinine was up from 2.2 in September 2014. Patient's white blood cell count is normal at 10.2 but with an 88% shift. Chest x-ray noted cardiomegaly, but no signs of infiltrate. Flu PCR was sent and patient was treated empirically for respiratory bronchitis. Hospitalists were called for further evaluation and admission.   Review of Systems:  Patient seen in emergency room. He is fatigued, complains of mild cough although feeling better. Denies any headaches, vision changes, dysphagia, chest pain, palpitations, wheezing. Some mild shortness of breath with exertion. Cough is mostly nonproductive although occasional mild yellow phlegm. Denies abdominal pain, hematuria, status post urostomy tubes which he feels are draining comfortably, no diarrhea. Patient states she's not had a bowel movement x3 or 4 days. He denies any focal extremity numbness weakness or pain. Review systems otherwise negative.  Past Medical History  Diagnosis Date  . Bladder cancer   . Hypertension    status post bilateral urostomy placement Chronic renal  disease   No past surgical history on file. Social History:  has no tobacco, alcohol, and drug history on file.  patient lives at home with his wife. He is normally able to ambulate without assistance  No Known Allergies  Family history: Hypertension  Prior to Admission medications   Medication Sig Start Date End Date Taking? Authorizing Provider  amLODipine (NORVASC) 10 MG tablet Take 10 mg by mouth daily.  01/04/13  Yes Historical Provider, MD  ampicillin (PRINCIPEN) 250 MG capsule Take 250 mg by mouth 4 (four) times daily.  12/05/13 12/19/13 Yes Historical Provider, MD  ciprofloxacin (CIPRO) 500 MG tablet Take 500 mg by mouth every 18 (eighteen) hours.  12/06/13 12/20/13 Yes Historical Provider, MD  furosemide (LASIX) 20 MG tablet Take 20 mg by mouth daily.  11/24/13  Yes Historical Provider, MD  losartan-hydrochlorothiazide (HYZAAR) 100-25 MG per tablet Take 1 tablet by mouth daily.   Yes Historical Provider, MD  ondansetron (ZOFRAN) 4 MG tablet Take 4 mg by mouth every 8 (eight) hours as needed for nausea.  12/05/13 01/04/14 Yes Historical Provider, MD  oxyCODONE-acetaminophen (PERCOCET/ROXICET) 5-325 MG per tablet Take 1-2 tablets by mouth every 6 (six) hours as needed for severe pain.   Yes Historical Provider, MD  polyethylene glycol (MIRALAX / GLYCOLAX) packet Take 17 g by mouth daily.   Yes Historical Provider, MD  senna (SENOKOT) 8.6 MG TABS tablet Take 1 tablet by mouth daily as needed for mild constipation.  12/04/13 12/11/13 Yes Historical Provider, MD  temazepam (RESTORIL) 30 MG capsule Take 1 capsule by mouth at bedtime as needed for sleep.  11/10/12  Yes Historical Provider, MD  simvastatin (ZOCOR)  20 MG tablet Take 20 mg by mouth daily at 6 PM.  11/24/13   Historical Provider, MD   Physical Exam: Filed Vitals:   12/06/13 2016  BP: 134/55  Pulse: 102  Temp: 103 F (39.4 C)    BP 134/55  Pulse 102  Temp(Src) 103 F (39.4 C) (Oral)  Ht 5\' 11"  (1.803 m)  Wt 79.379 kg (175  lb)  BMI 24.42 kg/m2  SpO2 94%  General:  Alert and oriented x3, no acute distress, fatigued HEENT: Normocephalic and atraumatic, mucous membranes slightly dry Cardiovascular: Regular rate and rhythm, S1-S2 Lungs: Clear to auscultation bilaterally Abdomen: Soft, nontender, nondistended, positive bowel sounds Extremities: No clubbing cyanosis, trace edema Psych: Patient is appropriate, no evidence psychoses Neuro: No focal deficits           Labs on Admission:  Basic Metabolic Panel:  Recent Labs Lab 12/06/13 2102  NA 138  K 3.9  CL 100  CO2 20  GLUCOSE 137*  BUN 35*  CREATININE 2.65*  CALCIUM 9.3   Liver Function Tests:  Recent Labs Lab 12/06/13 2102  AST 41*  ALT 30  ALKPHOS 69  BILITOT 0.5  PROT 8.0  ALBUMIN 3.9   No results found for this basename: LIPASE, AMYLASE,  in the last 168 hours No results found for this basename: AMMONIA,  in the last 168 hours CBC:  Recent Labs Lab 12/06/13 2102  WBC 10.2  NEUTROABS 9.0*  HGB 11.6*  HCT 34.1*  MCV 88.3  PLT 164   Cardiac Enzymes: No results found for this basename: CKTOTAL, CKMB, CKMBINDEX, TROPONINI,  in the last 168 hours  BNP (last 3 results) No results found for this basename: PROBNP,  in the last 8760 hours CBG: No results found for this basename: GLUCAP,  in the last 168 hours  Radiological Exams on Admission: Dg Chest 2 View  12/06/2013   CLINICAL DATA:  Cough and fever  EXAM: CHEST  2 VIEW  COMPARISON:  None.  FINDINGS: Mild cardiomegaly. Normal vascularity. Small left pleural effusion. Clear lungs. No pneumothorax.  IMPRESSION: Cardiomegaly without edema probe  Small left pleural effusion.   Electronically Signed   By: Maryclare Bean M.D.   On: 12/06/2013 21:55     Assessment/Plan Active Problems:   Malignant neoplasm of bladder, part unspecified , status post bilateral urostomy drains. Continue suppressive antibiotics   Fever: Could be secondary to flu, await PCR   Renal failure (ARF), acute  on chronic: Gentle hydration, holding diuretics and ARB   HTN (hypertension): Given worsening renal failure, the patient already having diuretics and ARB on hold we'll continue to hold this.   Constipation: Patient has not had bowel movement x3 days. Continue daily MiraLAX and Colace, fleets enema in the morning. Bronchitis: Given the fever and productive cough and the shift differential noting bandemia, will treat with IV Levaquin. Await flu PCR   Code Status: Full code as indicated by patients  Family Communication: Family present earlier, left for home  Disposition Plan: Home, possibly as early as tomorrow  Time spent: 31 min  [facility] Pager [phone]

## 2013-12-07 NOTE — Progress Notes (Signed)
Paged Dr. Carles Collet to notify of patient's arrival to room 1514

## 2013-12-07 NOTE — Progress Notes (Signed)
UR completed 

## 2013-12-08 ENCOUNTER — Inpatient Hospital Stay (HOSPITAL_COMMUNITY): Payer: Medicare Other

## 2013-12-08 DIAGNOSIS — C679 Malignant neoplasm of bladder, unspecified: Secondary | ICD-10-CM

## 2013-12-08 LAB — BASIC METABOLIC PANEL
BUN: 34 mg/dL — ABNORMAL HIGH (ref 6–23)
CO2: 22 mEq/L (ref 19–32)
Calcium: 8.5 mg/dL (ref 8.4–10.5)
Chloride: 103 mEq/L (ref 96–112)
Creatinine, Ser: 2.91 mg/dL — ABNORMAL HIGH (ref 0.50–1.35)
GFR, EST AFRICAN AMERICAN: 23 mL/min — AB (ref >90)
GFR, EST NON AFRICAN AMERICAN: 20 mL/min — AB (ref >90)
Glucose, Bld: 108 mg/dL — ABNORMAL HIGH (ref 70–99)
POTASSIUM: 3.9 meq/L (ref 3.7–5.3)
SODIUM: 139 meq/L (ref 137–147)

## 2013-12-08 LAB — CBC
HCT: 27.3 % — ABNORMAL LOW (ref 39.0–52.0)
HEMOGLOBIN: 9.3 g/dL — AB (ref 13.0–17.0)
MCH: 30 pg (ref 26.0–34.0)
MCHC: 34.1 g/dL (ref 30.0–36.0)
MCV: 88.1 fL (ref 78.0–100.0)
Platelets: 143 10*3/uL — ABNORMAL LOW (ref 150–400)
RBC: 3.1 MIL/uL — AB (ref 4.22–5.81)
RDW: 14.5 % (ref 11.5–15.5)
WBC: 10.3 10*3/uL (ref 4.0–10.5)

## 2013-12-08 LAB — URINE CULTURE
CULTURE: NO GROWTH
Colony Count: NO GROWTH
Colony Count: NO GROWTH
Culture: NO GROWTH
Special Requests: NORMAL
Special Requests: NORMAL

## 2013-12-08 LAB — LEGIONELLA ANTIGEN, URINE: Legionella Antigen, Urine: NEGATIVE

## 2013-12-08 LAB — GLUCOSE, CAPILLARY: Glucose-Capillary: 92 mg/dL (ref 70–99)

## 2013-12-08 MED ORDER — AMPICILLIN 250 MG PO CAPS
250.0000 mg | ORAL_CAPSULE | Freq: Three times a day (TID) | ORAL | Status: DC
  Administered 2013-12-08 – 2013-12-09 (×3): 250 mg via ORAL
  Filled 2013-12-08 (×7): qty 1

## 2013-12-08 MED ORDER — PIPERACILLIN-TAZOBACTAM 3.375 G IVPB
3.3750 g | Freq: Three times a day (TID) | INTRAVENOUS | Status: DC
  Filled 2013-12-08 (×2): qty 50

## 2013-12-08 MED ORDER — CIPROFLOXACIN HCL 500 MG PO TABS
500.0000 mg | ORAL_TABLET | Freq: Every day | ORAL | Status: DC
  Administered 2013-12-08 – 2013-12-09 (×2): 500 mg via ORAL
  Filled 2013-12-08 (×4): qty 1

## 2013-12-08 NOTE — Progress Notes (Signed)
Flush irrigated right and left nephrostomy tubes,  No resistance met

## 2013-12-08 NOTE — Progress Notes (Signed)
ANTIBIOTIC CONSULT NOTE - INITIAL  Pharmacy Consult for Cipro Indication: UTI  No Known Allergies  Patient Measurements: Height: 5\' 11"  (180.3 cm) Weight: 175 lb (79.379 kg) IBW/kg (Calculated) : 75.3  Vital Signs: Temp: 98.2 F (36.8 C) (01/14 0627) Temp src: Oral (01/14 0627) BP: 132/56 mmHg (01/14 0627) Pulse Rate: 73 (01/14 0627) Intake/Output from previous day: 01/13 0701 - 01/14 0700 In: 604.2 [P.O.:240; I.V.:364.2] Out: 1250 [Urine:1250] Intake/Output from this shift: Total I/O In: 240 [P.O.:240] Out: 200 [Urine:200]  Labs:  Recent Labs  12/06/13 2102 12/07/13 0505 12/08/13 0540  WBC 10.2 10.9* 10.3  HGB 11.6* 9.2* 9.3*  PLT 164 144* 143*  CREATININE 2.65* 2.61* 2.91*   Estimated Creatinine Clearance: 23.4 ml/min (by C-G formula based on Cr of 2.91). No results found for this basename: VANCOTROUGH, VANCOPEAK, VANCORANDOM, [place], [place], [place], [place], [place], TOBRARND, AMIKACINPEAK, AMIKACINTROU, AMIKACIN,  in the last 72 hours   Microbiology: Recent Results (from the past 720 hour(s))  CULTURE, BLOOD (ROUTINE X 2)     Status: None   Collection Time    12/06/13  9:00 PM      Result Value Range Status   Specimen Description BLOOD LEFT ARM   Final   Special Requests BOTTLES DRAWN AEROBIC ONLY 5CC   Final   Culture  Setup Time     Final   Value: 12/07/2013 01:06     Performed at [facility]   Culture     Final   Value:        BLOOD CULTURE RECEIVED NO GROWTH TO DATE CULTURE WILL BE HELD FOR 5 DAYS BEFORE ISSUING A FINAL NEGATIVE REPORT     Performed at [facility]   Report Status PENDING   Incomplete  URINE CULTURE     Status: None   Collection Time    12/06/13  9:24 PM      Result Value Range Status   Specimen Description URINE, RANDOM RIGHT NEPHROSTOMY   Final   Special Requests Normal   Final   Culture  Setup Time     Final   Value: 12/07/2013 04:02     Performed at [place]      Final   Value: NO GROWTH     Performed at [facility]   Culture     Final   Value: NO GROWTH     Performed at [facility]   Report Status 12/08/2013 FINAL   Final  URINE CULTURE     Status: None   Collection Time    12/06/13  9:24 PM      Result Value Range Status   Specimen Description URINE, RANDOM LEFT NEPHROSTOMY   Final   Special Requests Normal   Final   Culture  Setup Time     Final   Value: 12/07/2013 04:03     Performed at [place]     Final   Value: NO GROWTH     Performed at [facility]   Culture     Final   Value: NO GROWTH     Performed at [facility]   Report Status 12/08/2013 FINAL   Final  CULTURE, BLOOD (ROUTINE X 2)     Status: None   Collection Time    12/06/13  9:45 PM      Result Value Range Status   Specimen Description BLOOD RIGHT HAND   Final  Special Requests BOTTLES DRAWN AEROBIC AND ANAEROBIC 3CC   Final   Culture  Setup Time     Final   Value: 12/07/2013 01:06     Performed at [facility]   Culture     Final   Value:        BLOOD CULTURE RECEIVED NO GROWTH TO DATE CULTURE WILL BE HELD FOR 5 DAYS BEFORE ISSUING A FINAL NEGATIVE REPORT     Performed at [facility]   Report Status PENDING   Incomplete    Medical History: Past Medical History  Diagnosis Date  . Bladder cancer   . Hypertension     Medications:  Scheduled:  . amLODipine  10 mg Oral Daily  . ampicillin  250 mg Oral Q8H  . ciprofloxacin  500 mg Oral Q breakfast  . docusate sodium  100 mg Oral BID  . oseltamivir  30 mg Oral Daily  . polyethylene glycol  17 g Oral Daily  . senna  2 tablet Oral Daily  . simvastatin  20 mg Oral q1800  . sodium phosphate  1 enema Rectal Once   Infusions:  . sodium chloride Stopped (12/07/13 0837)  . sodium chloride 50 mL/hr at 12/08/13 0551   Anti-infectives: 1/12 >>Zosyn x 1  1/12 >>Vanc x 1 1/11 >> Ampicillin >> 1/13, resume 1/14 >> 1/13>> Levaquin >>  1/14 1/13 >> Zosyn >> 1/14 1/13 >> Tamiflu x5d >> (1/17) 1/14 >> Cipro >>  Assessment: 78 yr male with h/o bladder cancer with removal of bladder. Recent kidney blockage and urostomy bags placed Friday. Comes to ED with complaint of fever, cough, chills and nausea. Initially started on broad spectrum antibiotics.   Day #3 abx, currently levaquin and zosyn for empiric coverage  Hx CKD 3, SCr increasing 2.95, CrCl 23 ml/min  Blood and urine cultures negative to date  MD adjusting to ampicillin, and cipro for possible Enterobacter UTI   Goal of Therapy:  Eradication of infection Dose per renal function  Plan:   Cipro 500mg  PO daily for CrCl < 30 ml/min  Continue to follow renal function and adjust as needed  Ampicillin per MD  Peggyann Juba, PharmD, BCPS Pager: [phone] 12/08/2013,2:26 PM

## 2013-12-08 NOTE — Progress Notes (Signed)
[facility] PROGRESS NOTE  Michael Ball ZOX:[id number] DOB: Jul 26, 1936 DOA: 12/06/2013 PCP: Willow Ora, MD  Brief narrative 78 yo caucasian male with a PMH significant for HTN, hyperlipidemia and more recently diagnosed 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 He completed neoadjuvant chemotherapy with 6 cycles of GC completed 05/11/2013 and subsequently underwent a robotic radical cystoprostatectomy with ileal conduit and bilateral pelvic lymph node dissection on 06/28/2013 @[facility]  (Dr. Katrinka Blazing) with pathology revealing pT1-pN2 UC with incidental pT2a Gleason 4 prostate cancer. He reports that 3 weeks ago he began to develop intermittent "stomach ache", constant nausea and but only one episode of vomiting. He also reported Occasional loose/watery stools. No BRB, black stool,  Fevers, chills,  flank pain,or  dizziness. He has also developed LE edema R>L over the past three weeks. He was started on lasix by PCP but has not taken them yet. He reports increased fatigue over the past one month   A CT scan was performed on 12/02/13@[facility]  demonstrated worse bilateral hydroureteronephrosis and his creatinine was elevated to 3.3 from 1.7 on 09/27/13. On 12/03/2013, he underwent bilateral placement of  nephrostomy tubes due to high-grade strictures in the bilateral ureters. UA on 12/06/2013  at [facility] was positive for UTI. Patient presented to [facility] with 2 days of fever up to 104 at home. He also c/o lethargy, nausea, myalgia, and cough with white sputume without cp, sob or hemoptysis. His abdominal pain is improved since the nephrostomy tube placement.Patient was started levofloxacin and continued on ampicillin. Blood cultures and urine cultures were obtained.patient then transitioned to empiric Zosyn . FLU PCR was positive .   Assessment/Plan:  SIRS -likely due to influenza  -start empiric zosyn given  recent hospitalization and follow cultures  -d/c ampicillin while on zosyn  -UA at [facility] on 1/12 grew him to focus which is sensitive to ampicillin and Enterobacter which was sensitive to ciprofloxacin. I have switched antibiotics to Cipro and ampicillin. Blood culture so far negative   Influenza  -on Tamiflu  -droplet isolation   Acute on chronic renal failure (CKD3)  -Baseline creatinine 1.5-1.7  -Hold ACE/ARB and lasix -gentle hydration  - Urothelial carcinoma of the bladder  -Status post ileoconduit 06/28/2013 @[facility]   -Status post bilateral nephrostomy tubes 12/03/2013 for bilateral ureteral obstruction  -last chemo in June 2014  -Discussed we've his urologist Dr. Karleen Hampshire at [facility] today. Recommend stool monitor nephrostomy output. Total drain about 200 cc from the right and sent 50 cc from the left. Drained 75 cc from the urostomy. She informs me that his left kidney completely obstructed and drains from the nephrostomy tube only. The right kidney is partially obstructed. -I will check ultrasound of the kidneys to evaluate the nephrostomy tube. Ordered  For flushing of nephrostomy tube bid with 10 cc NS.   Hypertension  -continue amlodipine  -d/c losartan and lasix   Constipation  -On cathartics    Leg edema  -12/03/13 venous duplex @ [facility] was negative for DVT  -currently resolved  Hypoxemia  -likely due to influenza with pulm manifestations  -CXR negative for infiltrates  -supplemental oxygen  -aerosolized albuterol  -urine legionella antigen  -low suspicion of PE given clinical presentation and neg leg duplex   Weakness Will get PT eval  Diet: low sodium   Family Communication: none at beside  Disposition Plan: Home when medically stable   Antibiotics:  Levofloxacin 12/06/2013>>>  Tamiflu  12/07/2013>>>  Zosyn 12/07/2013>>> cipro and ampicillin (1/14>>)   Consultants:  None   Spoke with his urologist Dr. Frederico Hamman at [facility] over the phone. She needs to be informed  about any issues. Will call her tomorrow to update on the nephrostomy output and ultrasound results. Phone [phone]  Procedures:  none   HPI/Subjective: No overnight issues. Patient reports feeling better.  Objective: Filed Vitals:   12/08/13 1449  BP: 114/62  Pulse: 70  Temp: 99.7 F (37.6 C)  Resp: 20    Intake/Output Summary (Last 24 hours) at 12/08/13 1709 Last data filed at 12/08/13 1615  Gross per 24 hour  Intake 864.21 ml  Output   1650 ml  Net -785.79 ml   Filed Weights   12/06/13 2212  Weight: 79.379 kg (175 lb)    Exam:   General: Elderly male in no acute distress  HEENT: No pallor, moist oral mucosa  Chest: Clear to auscultation bilaterally, neither sounds  CVS: Normal S1 and S2, no murmurs rub or gallop  Abdomen: Bilateral nephrostomy tubes, urostomy tube, nondistended, nontender, bowel sounds present  Extremities: Warm, no edema  CNS: AAO x3    Data Reviewed: Basic Metabolic Panel:  Recent Labs Lab 12/06/13 2102 12/07/13 0505 12/08/13 0540  NA 138 138 139  K 3.9 3.5* 3.9  CL 100 103 103  CO2 20 20 22   GLUCOSE 137* 126* 108*  BUN 35* 33* 34*  CREATININE 2.65* 2.61* 2.91*  CALCIUM 9.3 8.2* 8.5   Liver Function Tests:  Recent Labs Lab 12/06/13 2102  AST 41*  ALT 30  ALKPHOS 69  BILITOT 0.5  PROT 8.0  ALBUMIN 3.9   No results found for this basename: LIPASE, AMYLASE,  in the last 168 hours No results found for this basename: AMMONIA,  in the last 168 hours CBC:  Recent Labs Lab 12/06/13 2102 12/07/13 0505 12/08/13 0540  WBC 10.2 10.9* 10.3  NEUTROABS 9.0*  --   --   HGB 11.6* 9.2* 9.3*  HCT 34.1* 26.3* 27.3*  MCV 88.3 87.7 88.1  PLT 164 144* 143*   Cardiac Enzymes: No results found for this basename: CKTOTAL, CKMB, CKMBINDEX, TROPONINI,  in the last 168 hours BNP (last 3 results) No results found for this basename: PROBNP,  in the last 8760 hours CBG:  Recent Labs Lab 12/08/13 1121  GLUCAP 92     Recent Results (from the past 240 hour(s))  CULTURE, BLOOD (ROUTINE X 2)     Status: None   Collection Time    12/06/13  9:00 PM      Result Value Range Status   Specimen Description BLOOD LEFT ARM   Final   Special Requests BOTTLES DRAWN AEROBIC ONLY 5CC   Final   Culture  Setup Time     Final   Value: 12/07/2013 01:06     Performed at [facility]   Culture     Final   Value:        BLOOD CULTURE RECEIVED NO GROWTH TO DATE CULTURE WILL BE HELD FOR 5 DAYS BEFORE ISSUING A FINAL NEGATIVE REPORT     Performed at [facility]   Report Status PENDING   Incomplete  URINE CULTURE     Status: None   Collection Time    12/06/13  9:24 PM      Result Value Range Status   Specimen Description URINE, RANDOM RIGHT NEPHROSTOMY   Final   Special Requests Normal   Final  Culture  Setup Time     Final   Value: 12/07/2013 04:02     Performed at [facility]   Colony Count     Final   Value: NO GROWTH     Performed at [facility]   Culture     Final   Value: NO GROWTH     Performed at [facility]   Report Status 12/08/2013 FINAL   Final  URINE CULTURE     Status: None   Collection Time    12/06/13  9:24 PM      Result Value Range Status   Specimen Description URINE, RANDOM LEFT NEPHROSTOMY   Final   Special Requests Normal   Final   Culture  Setup Time     Final   Value: 12/07/2013 04:03     Performed at [facility]   Colony Count     Final   Value: NO GROWTH     Performed at [facility]   Culture     Final   Value: NO GROWTH     Performed at [facility]   Report Status 12/08/2013 FINAL   Final  CULTURE, BLOOD (ROUTINE X 2)     Status: None   Collection Time    12/06/13  9:45 PM      Result Value Range Status   Specimen Description BLOOD RIGHT HAND   Final   Special Requests BOTTLES DRAWN AEROBIC AND ANAEROBIC 3CC   Final   Culture  Setup Time     Final   Value: 12/07/2013 01:06     Performed at [facility]   Culture     Final   Value:        BLOOD CULTURE RECEIVED NO GROWTH TO DATE CULTURE WILL BE HELD FOR 5 DAYS BEFORE ISSUING A FINAL NEGATIVE REPORT     Performed at [facility]   Report Status PENDING   Incomplete     Studies: Dg Chest 2 View  12/06/2013   CLINICAL DATA:  Cough and fever  EXAM: CHEST  2 VIEW  COMPARISON:  None.  FINDINGS: Mild cardiomegaly. Normal vascularity. Small left pleural effusion. Clear lungs. No pneumothorax.  IMPRESSION: Cardiomegaly without edema probe  Small left pleural effusion.   Electronically Signed   By: Maryclare BeanArt  Hoss M.D.   On: 12/06/2013 21:55   [place] Renal  12/08/2013   CLINICAL DATA:  History of radical cystoprostatectomy with ileal conduit. Elevated creatinine level.  EXAM: RENAL/URINARY TRACT ULTRASOUND COMPLETE  COMPARISON:  Lumbar spine MRI 03/29/2008  FINDINGS: Right Kidney:  Length: 11.0 cm. The right renal collecting system is markedly dilated. There is a ureter stent within the right kidney. In the lower pole of the right kidney, there are 2 round hypoechoic structures that are suggestive for cysts. Largest cyst measures 2.1 cm. There is marked dilatation of the right renal pelvis. Trace amount of right perinephric fluid.  Left Kidney:  Length: 11.7 cm. There is a ureter stent in the left renal pelvis. No significant left hydronephrosis. In the lower pole, there is a round hypoechoic structure that measures up to 1.9 cm and suggestive for a cyst. Evidence for left pleural fluid.  Bladder:  Surgically removed.  IMPRESSION: Moderate right hydronephrosis.  Bilateral ureter stents.  Left pleural effusion.  Bilateral renal cysts.   Electronically Signed   By: Richarda OverlieAdam  Henn M.D.   On: 12/08/2013 16:20    Scheduled Meds: . amLODipine  10 mg Oral Daily  . ampicillin  250 mg Oral Q8H  . ciprofloxacin  500 mg Oral Q breakfast  . docusate sodium  100 mg Oral BID  . oseltamivir  30 mg Oral Daily  . polyethylene glycol  17 g Oral Daily  . senna  2  tablet Oral Daily  . simvastatin  20 mg Oral q1800  . sodium phosphate  1 enema Rectal Once   Continuous Infusions: . sodium chloride Stopped (12/07/13 0837)  . sodium chloride 50 mL/hr at 12/08/13 0551      Time spent: 25 minutes    Michael Ball  [facility] Pager [phone]. If 7PM-7AM, please contact night-coverage at [email or web address], password [email or web address] 12/08/2013, 5:09 PM  LOS: 2 days

## 2013-12-09 DIAGNOSIS — N390 Urinary tract infection, site not specified: Secondary | ICD-10-CM | POA: Diagnosis present

## 2013-12-09 LAB — BASIC METABOLIC PANEL
BUN: 37 mg/dL — ABNORMAL HIGH (ref 6–23)
CHLORIDE: 103 meq/L (ref 96–112)
CO2: 24 mEq/L (ref 19–32)
CREATININE: 2.57 mg/dL — AB (ref 0.50–1.35)
Calcium: 8.8 mg/dL (ref 8.4–10.5)
GFR calc Af Amer: 26 mL/min — ABNORMAL LOW (ref >90)
GFR calc non Af Amer: 23 mL/min — ABNORMAL LOW (ref >90)
Glucose, Bld: 121 mg/dL — ABNORMAL HIGH (ref 70–99)
POTASSIUM: 3.6 meq/L — AB (ref 3.7–5.3)
Sodium: 139 mEq/L (ref 137–147)

## 2013-12-09 MED ORDER — OSELTAMIVIR PHOSPHATE 30 MG PO CAPS: 30.0000 mg | ORAL_CAPSULE | Freq: Every day | ORAL | Status: DC

## 2013-12-09 NOTE — Discharge Summary (Signed)
Physician Discharge Summary  Michael Ball [id number] DOB: [birth year] DOA: 12/06/2013  PCP: Leamon Arnt, MD  Admit date: 12/06/2013 Discharge date: 12/09/2013  Time spent: 40  minutes  Recommendations for Outpatient Follow-up:  1. Home with [place] resumed 2. Follow up with his urologist Dr Frederico Hamman in 1 week at [facility]  Discharge Diagnoses:  Principal Problem:   SIRS (systemic inflammatory response syndrome)  Active Problems:   Malignant neoplasm of bladder, part unspecified   Fever   Renal failure (ARF), acute on chronic   HTN (hypertension)   Constipation   Influenza A with respiratory manifestations   Acute on chronic renal failure   UTI (urinary tract infection)   Discharge Condition: FAIR  Diet recommendation: low sodium  Filed Weights   12/06/13 2212  Weight: 79.379 kg (175 lb)    History of present illness:  78 yo caucasian male with a PMH significant for HTN, hyperlipidemia and more recently diagnosed 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 He completed neoadjuvant chemotherapy with 6 cycles of GC completed 05/11/2013 and subsequently underwent a robotic radical cystoprostatectomy with ileal conduit and bilateral pelvic lymph node dissection on 06/28/2013 @[facility]  (Dr. Tamala Julian) with pathology revealing pT1-pN2 UC with incidental pT2a Gleason 4 prostate cancer. He reports that 3 weeks ago he began to develop intermittent "stomach ache", constant nausea and but only one episode of vomiting. He also reported Occasional loose/watery stools. No BRB, black stool, Fevers, chills, flank pain,or dizziness. He has also developed LE edema R>L over the past three weeks. He was started on lasix by PCP but has not taken them yet. He reports increased fatigue over the past one month  A CT scan was performed on 12/02/13@[facility]  demonstrated worse bilateral hydroureteronephrosis and his  creatinine was elevated to 3.3 from 1.7 on 09/27/13. On 12/03/2013, he underwent bilateral placement of nephrostomy tubes due to high-grade strictures in the bilateral ureters. UA on 12/06/2013 at [facility] was positive for UTI. Patient presented to [facility] with 2 days of fever up to 104 at home. He also c/o lethargy, nausea, myalgia, and cough with white sputume without cp, sob or hemoptysis. His abdominal pain is improved since the nephrostomy tube placement.Patient was started levofloxacin and continued on ampicillin. Blood cultures and urine cultures were obtained.patient then transitioned to empiric Zosyn . FLU PCR was positive .    Hospital Course:   SIRS  -likely due to influenza  -start empiric zosyn given recent hospitalization and follow cultures  -UA at [facility] on 1/12 grew him to focus which is sensitive to ampicillin and Enterobacter which was sensitive to ciprofloxacin. Resume home dose Cipro and ampicillin. Plan to treat for 2 weeks duration ( until 1/26). Blood culture so far negative.   Influenza  -on Tamiflu  -will treat or 5 days course  Acute on chronic renal failure (CKD3)  -Baseline creatinine 1.5-1.7 . Was 3.7 at [facility] prior to procedure. Now improcving to 2.57 with hydration.  -Hold ACE/ARB and lasix  -instructed on increased  fluid intake and avoid NSAIDs -  Urothelial carcinoma of the bladder  -Status post ileoconduit 06/28/2013 @[facility]   -Status post bilateral nephrostomy tubes 12/03/2013 for bilateral ureteral obstruction  -last chemo in June 2014  -Discussed with his urologist Dr. Frederico Hamman at [facility] on 1/14 and today. . She informs me that his left kidney completely obstructed and drains from the nephrostomy tube only. The right kidney is partially  obstructed.  Renal US shows moderate hydronephrosis of right kidney and normal left kidney. Tubes in place. nephrostomy tube flushed bid with 10 cc NS. Both nephrostomy tubes and urostomy draining adequately.  Hypertension  -continue  amlodipine  -d/c losartan and lasix given worsened renal function  Constipation  -resolved. continue bowel regimen  Leg edema  -12/03/13 venous duplex @ [facility] was negative for DVT  -currently resolved   Hypoxemia  -likely due to influenza with pulm manifestations  -CXR negative for infiltrates  -now stable -urine legionella antigen  -low suspicion of PE given clinical presentation and neg leg duplex   Weakness  Resume home health PT  Anemia  hb showed some drop to 9.3 . Needs follow up as outpt  Patient now remains afebrile and symptomatically improved.s table for d/c home with outpt urology follow up.   Diet: low sodium   Family Communication: Wife at beside  Disposition Plan: Home    Antibiotics:  Levofloxacin 12/06/2013>>>  Tamiflu 12/07/2013>>>  Zosyn 12/07/2013>>>  cipro and ampicillin (1/14>> to be completed on 1/26  Consultants:  None  Spoke with his urologist Dr. Frederico Hamman at [facility] over the phone. She was informed nephrostomy output and ultrasound results. Patient has appt with her next week on 1/21. She agrees with the discharge plan and outpt follow up. Phone [phone]  Procedures:  none     Discharge Exam: Filed Vitals:   12/09/13 0603  BP: 108/61  Pulse: 60  Temp: 98.2 F (36.8 C)  Resp: 18   General: Elderly male in no acute distress  HEENT: No pallor, moist oral mucosa  Chest: Clear to auscultation bilaterally, neither sounds  CVS: Normal S1 and S2, no murmurs rub or gallop  Abdomen: Bilateral nephrostomy tubes, urostomy tube, nondistended, nontender, bowel sounds present  Extremities: Warm, no edema  CNS: AAO x3   Discharge Instructions     Medication List    STOP taking these medications       furosemide 20 MG tablet  Commonly known as:  LASIX     losartan-hydrochlorothiazide 100-25 MG per tablet  Commonly known as:  HYZAAR      TAKE these medications       amLODipine 10 MG tablet  Commonly known as:  NORVASC  Take 10 mg  by mouth daily.     ampicillin 250 MG capsule  Commonly known as:  PRINCIPEN  Take 250 mg by mouth 4 (four) times daily.     CIPRO 500 MG tablet  Generic drug:  ciprofloxacin  Take 500 mg by mouth every 18 (eighteen) hours.     ondansetron 4 MG tablet  Commonly known as:  ZOFRAN  Take 4 mg by mouth every 8 (eight) hours as needed for nausea.     oseltamivir 30 MG capsule  Commonly known as:  TAMIFLU  Take 1 capsule (30 mg total) by mouth daily.     oxyCODONE-acetaminophen 5-325 MG per tablet  Commonly known as:  PERCOCET/ROXICET  Take 1-2 tablets by mouth every 6 (six) hours as needed for severe pain.     polyethylene glycol packet  Commonly known as:  MIRALAX / GLYCOLAX  Take 17 g by mouth daily.     senna 8.6 MG Tabs tablet  Commonly known as:  SENOKOT  Take 1 tablet by mouth daily as needed for mild constipation.     simvastatin 20 MG tablet  Commonly known as:  ZOCOR  Take 20 mg by mouth daily at 6 PM.  temazepam 30 MG capsule  Commonly known as:  RESTORIL  Take 1 capsule by mouth at bedtime as needed for sleep.       No Known Allergies     Follow-up Information   Follow up with [name],[name], MD In 1 week.   Specialty:  Family Medicine   Contact information:   [place] [place]  [phone]       Please follow up. (has appointment with his urologist Dr Frederico Hamman on 1/21 at [facility] )        The results of significant diagnostics from this hospitalization (including imaging, microbiology, ancillary and laboratory) are listed below for reference.    Significant Diagnostic Studies: Dg Chest 2 View  12/06/2013   CLINICAL DATA:  Cough and fever  EXAM: CHEST  2 VIEW  COMPARISON:  None.  FINDINGS: Mild cardiomegaly. Normal vascularity. Small left pleural effusion. Clear lungs. No pneumothorax.  IMPRESSION: Cardiomegaly without edema probe  Small left pleural effusion.   Electronically Signed   By: Maryclare Bean M.D.   On:  12/06/2013 21:55   US Renal  12/08/2013   CLINICAL DATA:  History of radical cystoprostatectomy with ileal conduit. Elevated creatinine level.  EXAM: RENAL/URINARY TRACT ULTRASOUND COMPLETE  COMPARISON:  Lumbar spine MRI 03/29/2008  FINDINGS: Right Kidney:  Length: 11.0 cm. The right renal collecting system is markedly dilated. There is a ureter stent within the right kidney. In the lower pole of the right kidney, there are 2 round hypoechoic structures that are suggestive for cysts. Largest cyst measures 2.1 cm. There is marked dilatation of the right renal pelvis. Trace amount of right perinephric fluid.  Left Kidney:  Length: 11.7 cm. There is a ureter stent in the left renal pelvis. No significant left hydronephrosis. In the lower pole, there is a round hypoechoic structure that measures up to 1.9 cm and suggestive for a cyst. Evidence for left pleural fluid.  Bladder:  Surgically removed.  IMPRESSION: Moderate right hydronephrosis.  Bilateral ureter stents.  Left pleural effusion.  Bilateral renal cysts.   Electronically Signed   By: Markus Daft M.D.   On: 12/08/2013 16:20    Microbiology: Recent Results (from the past 240 hour(s))  CULTURE, BLOOD (ROUTINE X 2)     Status: None   Collection Time    12/06/13  9:00 PM      Result Value Range Status   Specimen Description BLOOD LEFT ARM   Final   Special Requests BOTTLES DRAWN AEROBIC ONLY 5CC   Final   Culture  Setup Time     Final   Value: 12/07/2013 01:06     Performed at [facility]   Culture     Final   Value:        BLOOD CULTURE RECEIVED NO GROWTH TO DATE CULTURE WILL BE HELD FOR 5 DAYS BEFORE ISSUING A FINAL NEGATIVE REPORT     Performed at [facility]   Report Status PENDING   Incomplete  URINE CULTURE     Status: None   Collection Time    12/06/13  9:24 PM      Result Value Range Status   Specimen Description URINE, RANDOM RIGHT NEPHROSTOMY   Final   Special Requests Normal   Final   Culture  Setup Time      Final   Value: 12/07/2013 04:02     Performed at [place]     Final  Value: NO GROWTH     Performed at [facility]   Culture     Final   Value: NO GROWTH     Performed at [facility]   Report Status 12/08/2013 FINAL   Final  URINE CULTURE     Status: None   Collection Time    12/06/13  9:24 PM      Result Value Range Status   Specimen Description URINE, RANDOM LEFT NEPHROSTOMY   Final   Special Requests Normal   Final   Culture  Setup Time     Final   Value: 12/07/2013 04:03     Performed at [place]     Final   Value: NO GROWTH     Performed at [facility]   Culture     Final   Value: NO GROWTH     Performed at [facility]   Report Status 12/08/2013 FINAL   Final  CULTURE, BLOOD (ROUTINE X 2)     Status: None   Collection Time    12/06/13  9:45 PM      Result Value Range Status   Specimen Description BLOOD RIGHT HAND   Final   Special Requests BOTTLES DRAWN AEROBIC AND ANAEROBIC 3CC   Final   Culture  Setup Time     Final   Value: 12/07/2013 01:06     Performed at [facility]   Culture     Final   Value:        BLOOD CULTURE RECEIVED NO GROWTH TO DATE CULTURE WILL BE HELD FOR 5 DAYS BEFORE ISSUING A FINAL NEGATIVE REPORT     Performed at [facility]   Report Status PENDING   Incomplete     Labs: Basic Metabolic Panel:  Recent Labs Lab 12/06/13 2102 12/07/13 0505 12/08/13 0540 12/09/13 0845  NA 138 138 139 139  K 3.9 3.5* 3.9 3.6*  CL 100 103 103 103  CO2 20 20 22 24   GLUCOSE 137* 126* 108* 121*  BUN 35* 33* 34* 37*  CREATININE 2.65* 2.61* 2.91* 2.57*  CALCIUM 9.3 8.2* 8.5 8.8   Liver Function Tests:  Recent Labs Lab 12/06/13 2102  AST 41*  ALT 30  ALKPHOS 69  BILITOT 0.5  PROT 8.0  ALBUMIN 3.9   No results found for this basename: LIPASE, AMYLASE,  in the last 168 hours No results found for this basename: AMMONIA,  in the last 168  hours CBC:  Recent Labs Lab 12/06/13 2102 12/07/13 0505 12/08/13 0540  WBC 10.2 10.9* 10.3  NEUTROABS 9.0*  --   --   HGB 11.6* 9.2* 9.3*  HCT 34.1* 26.3* 27.3*  MCV 88.3 87.7 88.1  PLT 164 144* 143*   Cardiac Enzymes: No results found for this basename: CKTOTAL, CKMB, CKMBINDEX, TROPONINI,  in the last 168 hours BNP: BNP (last 3 results) No results found for this basename: PROBNP,  in the last 8760 hours CBG:  Recent Labs Lab 12/08/13 1121  GLUCAP 92       Signed:  Arva Slaugh  [facility] 12/09/2013, 10:44 AM

## 2013-12-09 NOTE — Progress Notes (Signed)
Pt discharged home with his wife.  Reviewed medications and follow up appointments.  There were no further questions at this time.

## 2013-12-13 LAB — CULTURE, BLOOD (ROUTINE X 2)
CULTURE: NO GROWTH
Culture: NO GROWTH

## 2014-01-05 ENCOUNTER — Other Ambulatory Visit: Payer: Self-pay | Admitting: Oncology

## 2014-01-05 ENCOUNTER — Telehealth: Payer: Self-pay | Admitting: Medical Oncology

## 2014-01-05 ENCOUNTER — Encounter: Payer: Self-pay | Admitting: Medical Oncology

## 2014-01-05 DIAGNOSIS — C679 Malignant neoplasm of bladder, unspecified: Secondary | ICD-10-CM

## 2014-01-05 NOTE — Telephone Encounter (Signed)
Michael Ball, nurse navigator with [facility] stating patient d/c yesterday from hosptial and Dr Alen Blew agreed to manage patient from Dr. Claris Pong. She states patient is having constipation, decrease intake, distended abdomen, dehydration and is declining quickly asking for Dr. Alen Blew to see patient soon and schedule for possible fluids.  Patient with sched appt to see MD 01/11/13, will review with MD regarding need for sooner apt.

## 2014-01-05 NOTE — Telephone Encounter (Signed)
Pamala Hurry, home health nurse, reporting patient with "extremely distended abdomen," states patient has positive bowel sounds and reports last BM today, mostly diarrhea.  Informed Pamala Hurry patient with new appt Friday to see MD as well have labs and IVF. States she will inform patient of appts.  MD aware.

## 2014-01-05 NOTE — Telephone Encounter (Signed)
Per MD, [facility] with Ms. Ninetta Lights informing her of patient's upcoming appt with MD, labs and IV infusion 02/13.   Schedulers to inform patient of appt.

## 2014-01-06 ENCOUNTER — Telehealth: Payer: Self-pay | Admitting: *Deleted

## 2014-01-06 NOTE — Telephone Encounter (Signed)
Per staff message and POF I have scheduled appts.  JMW  

## 2014-01-06 NOTE — Telephone Encounter (Signed)
sw pt gv appts for 01/07/14 to see [facility] @ 8:30am, labs@ 9am, ov@ 9:15am, and tx to follow. Pt is aware that tx will follow. i emailed MW to add the tx...td

## 2014-01-07 ENCOUNTER — Ambulatory Visit (HOSPITAL_BASED_OUTPATIENT_CLINIC_OR_DEPARTMENT_OTHER): Payer: Medicare Other | Admitting: Oncology

## 2014-01-07 ENCOUNTER — Other Ambulatory Visit (HOSPITAL_BASED_OUTPATIENT_CLINIC_OR_DEPARTMENT_OTHER): Payer: Medicare Other

## 2014-01-07 ENCOUNTER — Ambulatory Visit: Payer: Medicare Other

## 2014-01-07 ENCOUNTER — Encounter: Payer: Self-pay | Admitting: Oncology

## 2014-01-07 VITALS — BP 128/60 | HR 88 | Temp 97.4°F | Resp 18 | Ht 71.0 in | Wt 191.6 lb

## 2014-01-07 DIAGNOSIS — R109 Unspecified abdominal pain: Secondary | ICD-10-CM

## 2014-01-07 DIAGNOSIS — R142 Eructation: Secondary | ICD-10-CM

## 2014-01-07 DIAGNOSIS — C679 Malignant neoplasm of bladder, unspecified: Secondary | ICD-10-CM

## 2014-01-07 DIAGNOSIS — R141 Gas pain: Secondary | ICD-10-CM

## 2014-01-07 DIAGNOSIS — C677 Malignant neoplasm of urachus: Secondary | ICD-10-CM

## 2014-01-07 DIAGNOSIS — R143 Flatulence: Secondary | ICD-10-CM

## 2014-01-07 DIAGNOSIS — D649 Anemia, unspecified: Secondary | ICD-10-CM

## 2014-01-07 LAB — CBC WITH DIFFERENTIAL/PLATELET
BASO%: 0.2 % (ref 0.0–2.0)
Basophils Absolute: 0 10*3/uL (ref 0.0–0.1)
EOS%: 0.7 % (ref 0.0–7.0)
Eosinophils Absolute: 0.1 10*3/uL (ref 0.0–0.5)
HEMATOCRIT: 34.9 % — AB (ref 38.4–49.9)
HGB: 11.4 g/dL — ABNORMAL LOW (ref 13.0–17.1)
LYMPH#: 1.7 10*3/uL (ref 0.9–3.3)
LYMPH%: 12.7 % — ABNORMAL LOW (ref 14.0–49.0)
MCH: 29.8 pg (ref 27.2–33.4)
MCHC: 32.7 g/dL (ref 32.0–36.0)
MCV: 91.4 fL (ref 79.3–98.0)
MONO#: 0.7 10*3/uL (ref 0.1–0.9)
MONO%: 5.7 % (ref 0.0–14.0)
NEUT#: 10.5 10*3/uL — ABNORMAL HIGH (ref 1.5–6.5)
NEUT%: 80.7 % — AB (ref 39.0–75.0)
Platelets: 199 10*3/uL (ref 140–400)
RBC: 3.82 10*6/uL — ABNORMAL LOW (ref 4.20–5.82)
RDW: 15.6 % — ABNORMAL HIGH (ref 11.0–14.6)
WBC: 13 10*3/uL — ABNORMAL HIGH (ref 4.0–10.3)

## 2014-01-07 NOTE — Progress Notes (Signed)
Spoke with Michael Ball at [facility] [place]. Per dr Alen Blew, patient referred to [facility] [place]. Dr Alen Blew to be the attending, hospice physicians may do symptom management and have DNR signed. Hospice nurse to call family today and make arrangements for assessment of patient's needs 01/08/14.

## 2014-01-07 NOTE — Progress Notes (Signed)
Hematology and Oncology Follow Up Visit  Michael Ball [id number] 07/08/1936 78 y.o. 01/07/2014 9:28 AM Michael Ball,Michael L, [name], Michael Fetch, Ball   Principle Diagnosis: 78 year old with urothelial carcinoma of the bladder, high-grade, although it does not appear to be muscle invasive on the recent specimen. Now he has stage IV disease.  Prior Therapy: He received 6 treatments of BCG that concluded in November of 2013. He had a repeat biopsy that was  done on 12/23/2012. The case number was [id number] which showed atypical urothelial cells suspicious for malignancy. That was the cytology report. The actual surgical pathology was done on 12/15/2012 and that was case number [id number] and showed urothelial carcinoma high-grade, papillary, noninvasive. The lamina propria is present but without invasion of tumor with question of lymph node involvement.  Neoadjuvant chemotherapy with Cisplatin and Gemzar started on 01/12/13. He completed 6 cycles in 04/2013 He is status post cystoprostatectomy done on 06/28/2013 at [facility]. The margins were negative and that his tumor found to be T1 N2 residual disease.  Current therapy:  Observation and surveillance.  Interim History:  [name] is seen for routine follow-up today with his wife. He is a pleasant gentleman presents today for a followup visit. Since his last visit, he developed abdominal pain and distention and was hospitalized at [facility] and found to have a recurrence with stage IV disease. He has metastatic disease to the spine as well as peritoneal involvement. He was hospitalized earlier this week and was discharged and continue to be in poor shape. He has major decline in his performance status and have reported abdominal distention. He has poor by mouth intake and no nausea. No vomiting noted. Denies chest pain, SOB, DOE. He does report abdominal distention but no ascites. States hematuria has now completely resolved. He has not other bleeding.  No  neuropathy or any other complications. He is not able to ambulate at this time and for the most part he is in a chair or bed.  Medications: I have reviewed the patient's current medications.   Current Outpatient Prescriptions  Medication Sig Dispense Refill  . amLODipine (NORVASC) 10 MG tablet Take 10 mg by mouth daily.       Marland Kitchen docusate sodium (COLACE) 100 MG capsule Take 100 mg by mouth 2 (two) times daily.      . furosemide (LASIX) 20 MG tablet Take 20 mg by mouth as needed.      Marland Kitchen losartan-hydrochlorothiazide (HYZAAR) 100-25 MG per tablet Take 1 tablet by mouth daily.      . ondansetron (ZOFRAN) 4 MG tablet Take 4 mg by mouth every 8 (eight) hours as needed for nausea or vomiting.      Marland Kitchen oxyCODONE-acetaminophen (PERCOCET/ROXICET) 5-325 MG per tablet Take 1-2 tablets by mouth every 6 (six) hours as needed for severe pain.      . polyethylene glycol (MIRALAX / GLYCOLAX) packet Take 17 g by mouth daily.      . simvastatin (ZOCOR) 20 MG tablet Take 20 mg by mouth daily at 6 PM.       . temazepam (RESTORIL) 30 MG capsule Take 1 capsule by mouth at bedtime as needed for sleep.        No current facility-administered medications for this visit.    Allergies: No Known Allergies  Past Medical History, Surgical history, Social history, and Family History were reviewed and updated.  Review of Systems: Constitutional:  Negative for fever, chills, night sweats, anorexia, weight loss, pain. Cardiovascular: no  chest pain or dyspnea on exertion Respiratory: no cough, shortness of breath, or wheezing Neurological: no TIA or stroke symptoms .  Physical Exam: Blood pressure 128/60, pulse 88, temperature 97.4 F (36.3 C), temperature source Oral, resp. rate 18, height 5\' 11"  (1.803 m), weight 191 lb 9.6 oz (86.909 kg), SpO2 99.00%. ECOG: 3 General appearance: alert, cooperative and no distress Head: Normocephalic, without obvious abnormality, atraumatic Neck: no adenopathy, no carotid bruit, no  JVD, supple, symmetrical, trachea midline and thyroid not enlarged, symmetric, no tenderness/mass/nodules Lymph nodes: Cervical, supraclavicular, and axillary nodes normal. Heart:regular rate and rhythm, S1, S2 normal, no murmur, click, rub or gallop Lung:chest clear, no wheezing, rales, normal symmetric air entry, no tachypnea, retractions or cyanosis Abdomen: Distended with good bowel sounds. EXT:no erythema, induration, or nodules. 1+ edema noted in his bilateral extremities.   Lab Results: Lab Results  Component Value Date   WBC 13.0* 01/07/2014   HGB 11.4* 01/07/2014   HCT 34.9* 01/07/2014   MCV 91.4 01/07/2014   PLT 199 01/07/2014     Chemistry      Component Value Date/Time   NA 139 12/09/2013 0845   NA 141 08/05/2013 1449   K 3.6* 12/09/2013 0845   K 4.2 08/05/2013 1449   CL 103 12/09/2013 0845   CL 105 05/11/2013 0816   CO2 24 12/09/2013 0845   CO2 21* 08/05/2013 1449   BUN 37* 12/09/2013 0845   BUN 54.1* 08/05/2013 1449   CREATININE 2.57* 12/09/2013 0845   CREATININE 2.2* 08/05/2013 1449      Component Value Date/Time   CALCIUM 8.8 12/09/2013 0845   CALCIUM 9.5 08/05/2013 1449   ALKPHOS 69 12/06/2013 2102   ALKPHOS 72 08/05/2013 1449   AST 41* 12/06/2013 2102   AST 19 08/05/2013 1449   ALT 30 12/06/2013 2102   ALT 17 08/05/2013 1449   BILITOT 0.5 12/06/2013 2102   BILITOT 0.34 08/05/2013 1449     Impression and Plan: This is a 78 year old male with the following issues: 1. Urothelial carcinoma of the bladder, high grade. He is S/P neoadjuvant chemotherapy with Cisplatin and Gemzar completed in 04/2013 with excellent response. He underwent surgical resection with residual tumor of T1 N2 disease and and now has biopsy-proven stage IV disease. Options of treatments were discussed today with the patient and his family including palliative chemotherapy which she is not a candidate for versus best supportive care. Given his overall deterioration in his health I feel that he is a candidate for  hospice as he continues to decline and have very poor performance status. I will make the referral to hospice to assume symptom management at this time. 3. Anemia: His hemoglobin is rather stable he did require a packed red cell transfusion postoperatively but I do not think he needed at this time. 4. Pain: Has very little pain at this time he does have pain medication at home which he has not been using. 5. Abdominal distention: I see no evidence of ascites and this is all related to his cancer recurrence. 4. Prognosis: He understands his prognosis is very and probably has very limited life expectancy of 6 months or less.  40 minutes spent today discussing the diagnosis and prognosis with the patient and his family or than 50% of the post face-to-face.      [facility] 2/13/20159:28 AM

## 2014-01-11 ENCOUNTER — Other Ambulatory Visit: Payer: Medicare Other

## 2014-01-11 ENCOUNTER — Ambulatory Visit: Payer: Medicare Other

## 2014-01-11 ENCOUNTER — Ambulatory Visit: Payer: Medicare Other | Admitting: Oncology

## 2014-01-13 ENCOUNTER — Telehealth: Payer: Self-pay | Admitting: Medical Oncology

## 2014-01-13 NOTE — Telephone Encounter (Signed)
Butch Penny, nurse with [facility] and [place] stating patient with "increased weakness, lots of ascites, decreased food intake." Asking whether Dr. Alen Blew might want a palliative paracentesis evaluation. Reports patient with incontinence and diarrhea.   Call return to Butch Penny [facility]) informed her, per MD notes, hospice physician to do symptom management. Also informed Butch Penny that MD saw patient 02/13 and evaluated pt for ascites as well as symptoms reported above. Asked Butch Penny to return call and inform office whether patient is having new difficulties/concerns.

## 2014-01-14 ENCOUNTER — Telehealth: Payer: Self-pay | Admitting: Medical Oncology

## 2014-01-14 ENCOUNTER — Emergency Department (HOSPITAL_COMMUNITY)
Admission: EM | Admit: 2014-01-14 | Discharge: 2014-01-23 | Disposition: E | Attending: Internal Medicine | Admitting: Internal Medicine

## 2014-01-14 ENCOUNTER — Encounter (HOSPITAL_COMMUNITY): Payer: Self-pay | Admitting: Emergency Medicine

## 2014-01-14 DIAGNOSIS — C679 Malignant neoplasm of bladder, unspecified: Secondary | ICD-10-CM | POA: Insufficient documentation

## 2014-01-14 DIAGNOSIS — C7951 Secondary malignant neoplasm of bone: Secondary | ICD-10-CM | POA: Insufficient documentation

## 2014-01-14 DIAGNOSIS — C7952 Secondary malignant neoplasm of bone marrow: Secondary | ICD-10-CM

## 2014-01-14 DIAGNOSIS — R141 Gas pain: Secondary | ICD-10-CM | POA: Insufficient documentation

## 2014-01-14 DIAGNOSIS — D649 Anemia, unspecified: Secondary | ICD-10-CM

## 2014-01-14 DIAGNOSIS — R0902 Hypoxemia: Secondary | ICD-10-CM | POA: Diagnosis present

## 2014-01-14 DIAGNOSIS — Z66 Do not resuscitate: Secondary | ICD-10-CM | POA: Insufficient documentation

## 2014-01-14 DIAGNOSIS — I959 Hypotension, unspecified: Secondary | ICD-10-CM | POA: Insufficient documentation

## 2014-01-14 DIAGNOSIS — Z936 Other artificial openings of urinary tract status: Secondary | ICD-10-CM | POA: Insufficient documentation

## 2014-01-14 DIAGNOSIS — R142 Eructation: Secondary | ICD-10-CM | POA: Insufficient documentation

## 2014-01-14 DIAGNOSIS — Z79899 Other long term (current) drug therapy: Secondary | ICD-10-CM | POA: Insufficient documentation

## 2014-01-14 DIAGNOSIS — R5383 Other fatigue: Secondary | ICD-10-CM | POA: Diagnosis present

## 2014-01-14 DIAGNOSIS — I10 Essential (primary) hypertension: Secondary | ICD-10-CM | POA: Insufficient documentation

## 2014-01-14 DIAGNOSIS — R143 Flatulence: Secondary | ICD-10-CM

## 2014-01-14 DIAGNOSIS — Z87891 Personal history of nicotine dependence: Secondary | ICD-10-CM | POA: Insufficient documentation

## 2014-01-14 DIAGNOSIS — R0602 Shortness of breath: Secondary | ICD-10-CM | POA: Insufficient documentation

## 2014-01-14 MED ORDER — LORAZEPAM 2 MG/ML IJ SOLN
1.0000 mg | INTRAMUSCULAR | Status: DC | PRN
Start: 2014-01-14 — End: 2014-01-14
  Administered 2014-01-14: 1 mg via INTRAVENOUS
  Filled 2014-01-14: qty 1

## 2014-01-14 MED ORDER — LORAZEPAM 2 MG/ML IJ SOLN
1.0000 mg | Freq: Once | INTRAMUSCULAR | Status: AC
  Administered 2014-01-14: 1 mg via INTRAVENOUS
  Filled 2014-01-14: qty 1

## 2014-01-14 MED ORDER — MORPHINE SULFATE 4 MG/ML IJ SOLN
4.0000 mg | Freq: Once | INTRAMUSCULAR | Status: AC
  Administered 2014-01-14: 4 mg via INTRAVENOUS
  Filled 2014-01-14: qty 1

## 2014-01-14 MED ORDER — MORPHINE SULFATE 4 MG/ML IJ SOLN
4.0000 mg | INTRAMUSCULAR | Status: DC | PRN
  Administered 2014-01-14: 4 mg via INTRAVENOUS
  Filled 2014-01-14: qty 1

## 2014-01-23 NOTE — ED Notes (Addendum)
Dr. Leonides Schanz at bedside explaining treatment options to patient who is terminally ill with cancer.  Patient is seen by hospice at home.  Patient reports he wants comfort measures only before he dies.  Wife at bedside and agrees.

## 2014-01-23 NOTE — ED Notes (Signed)
Per md pt to have comfort care, oxygen and pain meds.

## 2014-01-23 NOTE — ED Notes (Signed)
Per family request patient removed from monitors.  Comfort measures only.

## 2014-01-23 NOTE — Progress Notes (Signed)
   CARE MANAGEMENT ED NOTE 2014-01-25  Patient:  Michael Ball, Michael Ball   Account Number:  [id number]  Date Initiated:  01/25/14  Documentation initiated by:  Jackelyn Poling  Subjective/Objective Assessment:   78 yr old male medicare with bladder cancer lived at home with wife, abdominal distension, abdominal pain, sob has colostomy, right side, urinary catheter sent to ED by home hospice RN Pt at end of life stage, 82/62, Pt on 4 L O2 sat     Subjective/Objective Assessment Detail:   Pt informed EDP his wish was to pass at home but would admit to hospital if wife preferred Pt asleep when ED CM went to see him  Wife initially refused chaplain services  [place] confirmed pt is not eligible for continuous hospice services in the home per hospice supervisor  Pt expired at 1300 with family at bedside     Action/Plan:   CM spoke with EDP, Ward, wife & Dtr,  staff at [facility] [place] at [phone] at 1122, at 1156 Orders entered for consult to palliative and triad hospitalist for admission for end of life ans symptom management. Cm updated ED RN   Action/Plan Detail:   CM spoke with family and offered her sympathy for their loss   Anticipated DC Date:  01-25-2014     Status Recommendation to Physician:   Result of Recommendation:    Other ED Services  Consult Working Plan   In-house referral  Hospice / [place]  Other   [facility] Choice  HOSPICE   Choice offered to / List presented to:            Status of service:  Completed, signed off  ED Comments:   ED Comments Detail:

## 2014-01-23 NOTE — ED Notes (Signed)
Hospice patient with bladder cancer lives at home with wife, developed abdominal distention over the last several days and is having diffuse abdominal pain and SOB.  Patient has a colostomy on right side and urinary catheter.

## 2014-01-23 NOTE — ED Notes (Signed)
MD at bedside. 

## 2014-01-23 NOTE — ED Notes (Signed)
Bed: WA06 Expected date:  Expected time:  Means of arrival:  Comments: EMS 

## 2014-01-23 NOTE — Progress Notes (Addendum)
Inpatient [facility] ED Rm 6 J. [place] [facility] RN Visit- M. Wynetta Emery, RN  Addendum: 1:00pm stepped back into room with family around pt's bed pt had stooped breathing; notified staff RN, Ivar Drape who pronounced with ED physician; emotional support offered to family; patient's brother sister and brother-in-law in ED waiting room brought back to conference room; Patient Advocate and Chaplain notified by staff; [facility] team notified. Family tearful appreciative of  ED staff support. Danton Sewer, RN  Related admission to [facility] diagnosis of Bladder cancer; spoke with family and family who confirm they want comfort no machines or aggressive measure; pt able to waken and stated he wants to be comfortable DNR code status also confirmed by ED physician  Dr Leonides Schanz   Pt seen lying on stretcher lethargic RR=40 on 4LNC, mottling noted in hands feet knees extremities cold, pt intermittently restless; low systolic BP;  family reports pt c/o pain in abdomen. Wife distraugt and feels she cannot care for pt at home with these increased symptoms.  Comfort meds in place per ED physician who is also going to request PMT consult for EOL symptom management and admission for EOL care. - Probation officer spoke with staff RN, Dr Leonides Schanz and ED CM Kim regarding current status Wife daughter Angela Nevin and son Aaron Edelman at bedside, tearful aware pt has taken a turn and appears to be declining rapidly; they want to honor his wishes for comfort; [facility] SW and Chaplain aware of pt's current status and will make visit for family/pt support Patient's home medication list and transfer summary  is on shadow chart.   Please call [facility] @ [phone] with any hospice needs.   Thank you.  Danton Sewer, RN  [facility]  Hospice Liaison  [phone])

## 2014-01-23 NOTE — ED Notes (Signed)
Pt time of death. md verified.

## 2014-01-23 NOTE — H&P (Addendum)
[facility] History and Physical  Michael Ball IFO:[id number] DOB: September 18, 1936 DOA: February 06, 2014  Referring physician: ED physician PCP: Leamon Arnt, MD   Chief Complaint: lethargy  HPI:  Pt is 78 yo male with progressive bladder cancer brought in to [facility] ED due to lethargy. Family wants pt to be in the hospital as they feel he is approaching end of life. [facility] asked to admit for comfort care. Please note that pt has passed away in ED, prior to being seen by hospitalist.   Assessment and Plan: Active Problems: Comfort care - pt was started on morphine and ativan in ED and PCT consulted - pt has passed away prior to being seen by hospitalist - ED informed hospitalist that PCP will fill out death certificate  - no blood work done in order to ensure full comfort    Past Medical History  Diagnosis Date  . Bladder cancer   . Hypertension     Past Surgical History  Procedure Laterality Date  . Ileal conduit with stoma  06/2013  . Cystostomy w/ bladder biopsy    . Nephrostomy x 2  12/03/2013     Family History  Problem Relation Age of Onset  . Heart disease Mother   . Lung cancer Mother     Prior to Admission medications   Medication Sig Start Date End Date Taking? Authorizing Provider  amLODipine (NORVASC) 10 MG tablet Take 10 mg by mouth daily.  01/04/13  Yes Historical Provider, MD  docusate sodium (COLACE) 100 MG capsule Take 100 mg by mouth 2 (two) times daily.   Yes Historical Provider, MD  furosemide (LASIX) 20 MG tablet Take 20 mg by mouth daily as needed for fluid.  11/24/13  Yes Historical Provider, MD  losartan-hydrochlorothiazide (HYZAAR) 100-25 MG per tablet Take 1 tablet by mouth daily. 10/16/13  Yes Historical Provider, MD  ondansetron (ZOFRAN) 4 MG tablet Take 4 mg by mouth every 8 (eight) hours as needed for nausea or vomiting.   Yes Historical Provider, MD  oxyCODONE-acetaminophen (PERCOCET/ROXICET) 5-325 MG per tablet Take 1-2 tablets by mouth every 6  (six) hours as needed for severe pain.   Yes Historical Provider, MD  polyethylene glycol (MIRALAX / GLYCOLAX) packet Take 17 g by mouth daily as needed for mild constipation.    Yes Historical Provider, MD  simvastatin (ZOCOR) 20 MG tablet Take 20 mg by mouth daily at 6 PM.  11/24/13  Yes Historical Provider, MD  temazepam (RESTORIL) 30 MG capsule Take 30 mg by mouth at bedtime.  11/10/12  Yes Historical Provider, MD    Physical Exam: Filed Vitals:   2014/02/06 1026  BP: 82/62  Pulse: 69  Resp: 56  SpO2: 68%   Labs on Admission:  Basic Metabolic Panel: No results found for this basename: NA, K, CL, CO2, GLUCOSE, BUN, CREATININE, CALCIUM, MG, PHOS,  in the last 168 hours Liver Function Tests: No results found for this basename: AST, ALT, ALKPHOS, BILITOT, PROT, ALBUMIN,  in the last 168 hours No results found for this basename: LIPASE, AMYLASE,  in the last 168 hours No results found for this basename: AMMONIA,  in the last 168 hours CBC: No results found for this basename: WBC, NEUTROABS, HGB, HCT, MCV, PLT,  in the last 168 hours Cardiac Enzymes: No results found for this basename: CKTOTAL, CKMB, CKMBINDEX, TROPONINI,  in the last 168 hours BNP: No components found with this basename: POCBNP,  CBG: No results found for this basename: GLUCAP,  in the last  168 hours  Radiological Exams on Admission: No results found.  EKG: Normal sinus rhythm, no ST/T wave changes  Faye Ramsay, MD  [facility] Pager [phone]  If 7PM-7AM, please contact night-coverage [email or web address] Password [facility] Jan 18, 2014, 12:24 PM

## 2014-01-23 NOTE — ED Notes (Signed)
Chaplain at bedside with family 

## 2014-01-23 NOTE — Progress Notes (Signed)
06-Feb-2014 1400  Clinical Encounter Type  Visited With Family;Health care provider [facility] ED RN, Hospice RN)  Visit Type Death;Spiritual support;Social support  Referral From Nurse  Spiritual Encounters  Spiritual Needs Grief support;Emotional;Prayer  Stress Factors  Family Stress Factors Loss   Per family, pt goes by "[name]."  Visited with Michael Ball's family to express condolences and to provide bereavement support.  Provided spiritual accompaniment and witness to family's stories as they shared and reflected about Michael Ball's gifts and adventures, as well as prayer per request.  Family very welcoming of pastoral presence.  Family does not desire to view pt prior to leaving and is not expecting anyone else to join them at the hospital.  Family has chosen funeral home, and wife Joaquim Lai made a call to her nephew to begin making funeral arrangements (she would like him to preach).  Family present:  Wife, daughter Angela Nevin, son, sister, brother, BIL.  Please page if further needs arise:  [phone].  Thank you.  [place], [place]

## 2014-01-23 NOTE — ED Provider Notes (Addendum)
TIME SEEN: 10:28 AM  CHIEF COMPLAINT: Shortness of breath, back pain, abdominal pain  HPI: Patient is a 78 year old male with a history of bladder cancer with bony metastases who is on hospice who presents to the emergency department with worsening abdominal distention, shortness of breath, abdominal pain and back pain that started over the past several days has progressively become worse. Per patient's hospice nurse he has had decreased output from his nephrostomy tubes. He denies a history fever, cough, vomiting or diarrhea.    ROS: See HPI Constitutional: no fever  Eyes: no drainage  ENT: no runny nose   Cardiovascular:  no chest pain  Resp:  SOB  GI: no vomiting GU: no dysuria Integumentary: no rash  Allergy: no hives  Musculoskeletal: no leg swelling  Neurological: no slurred speech ROS otherwise negative  PAST MEDICAL HISTORY/PAST SURGICAL HISTORY:  Past Medical History  Diagnosis Date  . Bladder cancer   . Hypertension     MEDICATIONS:  Prior to Admission medications   Medication Sig Start Date End Date Taking? Authorizing Provider  amLODipine (NORVASC) 10 MG tablet Take 10 mg by mouth daily.  01/04/13  Yes Historical Provider, MD  docusate sodium (COLACE) 100 MG capsule Take 100 mg by mouth 2 (two) times daily.   Yes Historical Provider, MD  furosemide (LASIX) 20 MG tablet Take 20 mg by mouth daily as needed for fluid.  11/24/13  Yes Historical Provider, MD  losartan-hydrochlorothiazide (HYZAAR) 100-25 MG per tablet Take 1 tablet by mouth daily. 10/16/13  Yes Historical Provider, MD  ondansetron (ZOFRAN) 4 MG tablet Take 4 mg by mouth every 8 (eight) hours as needed for nausea or vomiting.   Yes Historical Provider, MD  oxyCODONE-acetaminophen (PERCOCET/ROXICET) 5-325 MG per tablet Take 1-2 tablets by mouth every 6 (six) hours as needed for severe pain.   Yes Historical Provider, MD  polyethylene glycol (MIRALAX / GLYCOLAX) packet Take 17 g by mouth daily as needed for  mild constipation.    Yes Historical Provider, MD  simvastatin (ZOCOR) 20 MG tablet Take 20 mg by mouth daily at 6 PM.  11/24/13  Yes Historical Provider, MD  temazepam (RESTORIL) 30 MG capsule Take 30 mg by mouth at bedtime.  11/10/12  Yes Historical Provider, MD    ALLERGIES:  No Known Allergies  SOCIAL HISTORY:  History  Substance Use Topics  . Smoking status: Former Smoker -- 1.00 packs/day    Types: Cigarettes  . Smokeless tobacco: Never Used  . Alcohol Use: No    FAMILY HISTORY: Family History  Problem Relation Age of Onset  . Heart disease Mother   . Lung cancer Mother     EXAM: There were no vitals taken for this visit. CONSTITUTIONAL: Alert and oriented x3 and responds appropriately to questions. Elderly, chronically ill-appearing, in respiratory distress HEAD: Normocephalic EYES: Conjunctivae clear, PERRL ENT: normal nose; no rhinorrhea; moist mucous membranes; pharynx without lesions noted NECK: Supple, no meningismus, no LAD  CARD: RRR; S1 and S2 appreciated; no murmurs, no clicks, no rubs, no gallops RESP: Normal chest excursion; patient is tachypneic with good aeration and clear breath sounds no rhonchi or rales or wheezing, patient is in moderate respiratory distress, patient is hypoxic ABD/GI: Normal bowel sounds; non-distended; soft, non-tender, no rebound, no guarding BACK:  The back appears normal and is non-tender to palpation, there is no CVA tenderness EXT: Normal ROM in all joints; non-tender to palpation; no edema; normal capillary refill; no cyanosis    SKIN: Normal color  for age and race; warm NEURO: Moves all extremities equally PSYCH: The patient's mood and manner are appropriate. Grooming and personal hygiene are appropriate.  MEDICAL DECISION MAKING: When I discussed with patient and his wife and daughter at bedside, they report the patient is a DO NOT RESUSCITATE/DO NOT INTUBATE. I have discussed with patient that I feel he likely has a  life-threatening illness causing his symptoms as he is hypotensive, hypoxic and in respiratory distress. Have discussed with patient and family that even with treatment he will likely have a poor outcome. Patient has decided he does not want any further intervention including IV fluids, antibiotics, blood work, imaging. He would like to be made comfort care only. Patient reports that he would like to be discharged home to die at home. His wife feels she is unable to take care of him at this time. He has a hospice nurse that comes to the house 3 times a week. Will discuss with case management to see if there are resources available so we are able to send the patient home to die. If not, patient states he is okay with being admitted to the hospital. We'll give IV Ativan, morphine, oxygen as needed.  ED PROGRESS: Patient is more comfortable after morphine and Ativan. Kim, nurse manager is attempting to get hospice in place for 24-hour care but is unable to do so at this time. Will admit patient to the hospital. Consult as Dr. Billey Chang with palliative care.   12:22 PM  Spoke with Dr. Doyle Askew, hospitalist for admission.   1:00 PM  Pt died in the ED.  No heart sounds, pulse or respirations.  Family at bedside.  TOD 1:00 PM.  Dr. Billey Chang contacted (pt's PCP) who will complete death certificate.  [place], DO 01-27-2014 [place], DO Jan 27, 2014 Alma Ward, DO 01-27-14 1327

## 2014-01-23 NOTE — ED Notes (Signed)
Hospice nurse called triage nurse, Hinton Dyer [phone]). She reports pt has hx of bladder cancer with bone met. Also has partial bowel obstruction. Has bil nephrostomy tubes and urostomy. No output through nephrostomy tubes overnight. Pt has abdominal distension, hospice nurse said looks like pt is 9 months pregnant due to abd distension, due to distension pt has SOB 95% on 4 L. Pt has had surgeries at [facility], but Dr Maceo Pro is pts cancer doctor.  Pt was discharged from [facility] Feb 10th for treatment of partial bowel obstruction. Pt has not been eating or drinking. Pt took oxycodone 5 mg at 0230 and 0830.

## 2014-01-23 NOTE — ED Notes (Signed)
All family has left. Pt will be transported to the morgue shortly.

## 2014-01-23 NOTE — Telephone Encounter (Signed)
Return call from Butch Penny, nurse with hospice, this am, reporting that patient's spouse called this morning that patient having difficulty with breathing O2 saturation @ 86% on 4 L, reports patient's abdomen distended and "looks nine months pregnant," edema,  per donna spouse also reports pt with no output from bilat nephrostomy, continues with incontinence of bowels. States patient doing poorly. Reviewed with MD, patient to go to ED for eval.  Butch Penny to call spouse to have pt proceed to ED.   0945 Confirmed with Butch Penny that patient is proceeding to [facility] ED.

## 2014-01-23 DEATH — deceased

## 2014-02-23 DEATH — deceased

## 2015-11-01 IMAGING — US US RENAL
1 series · 14 of 25 positions shown · non-contrast
Comparison: Lumbar spine MRI 03/29/2008

CLINICAL DATA: History of radical cystoprostatectomy with ileal
conduit. Elevated creatinine level.

EXAM:
RENAL/URINARY TRACT ULTRASOUND COMPLETE

[Series 1: us renal · 0.24mm/px · 14 of 45 slices shown]
[im 1/45]
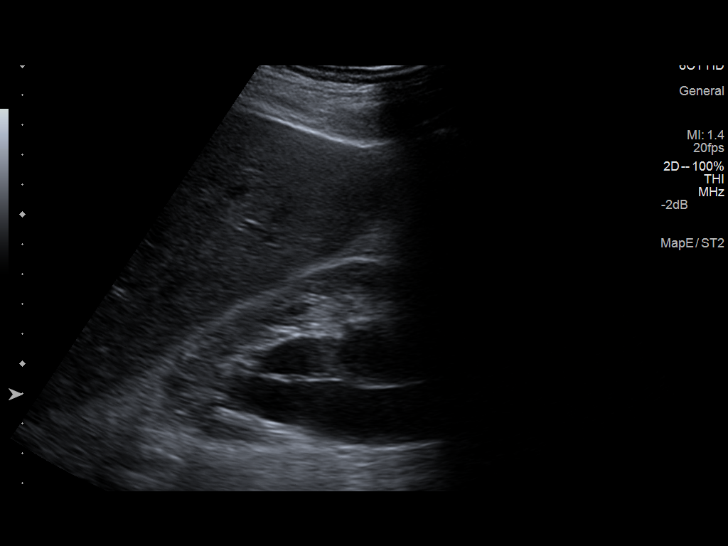
[im 4/45]
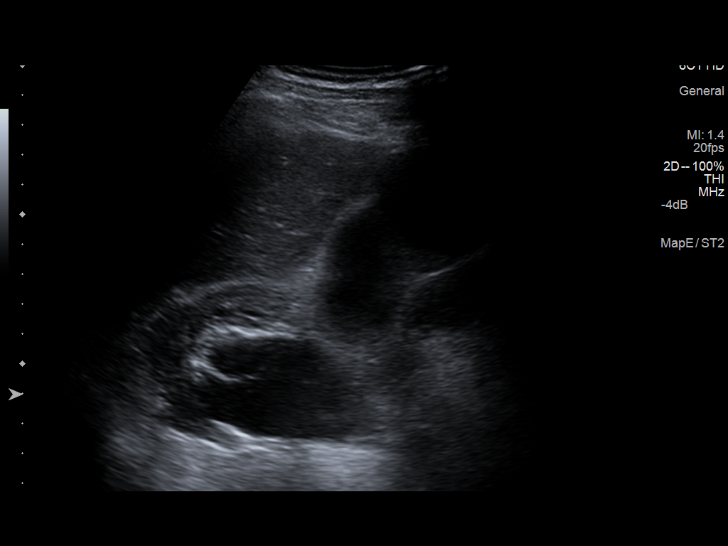
[im 8/45]
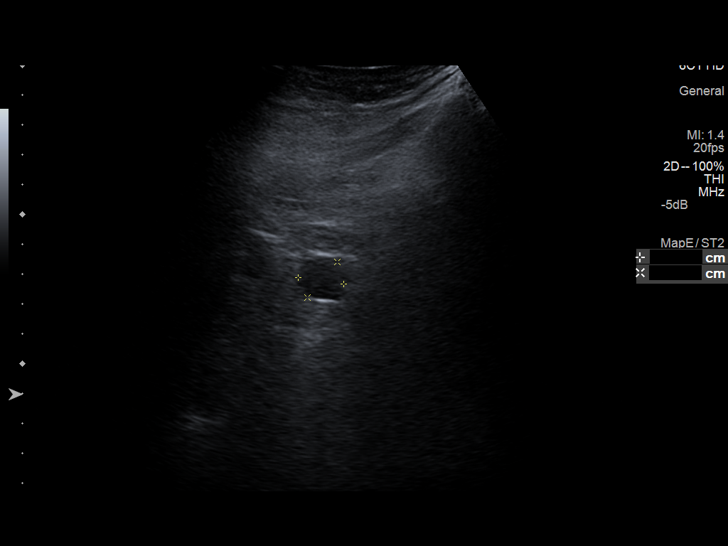
[im 12/45]
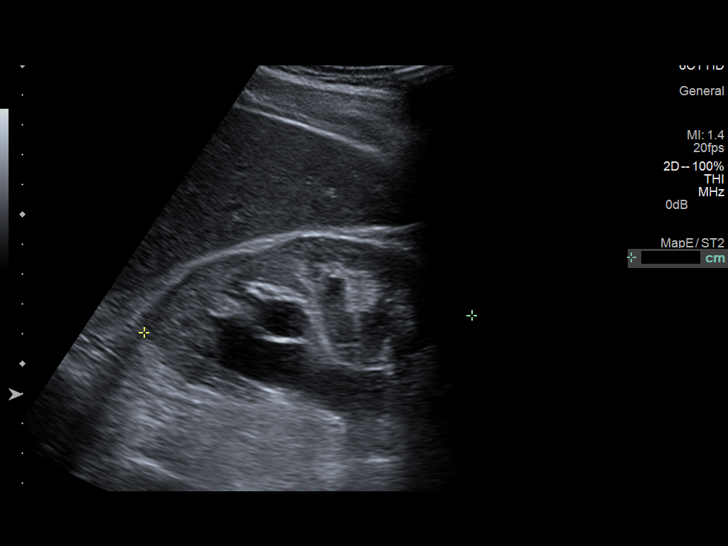
[im 15/45]
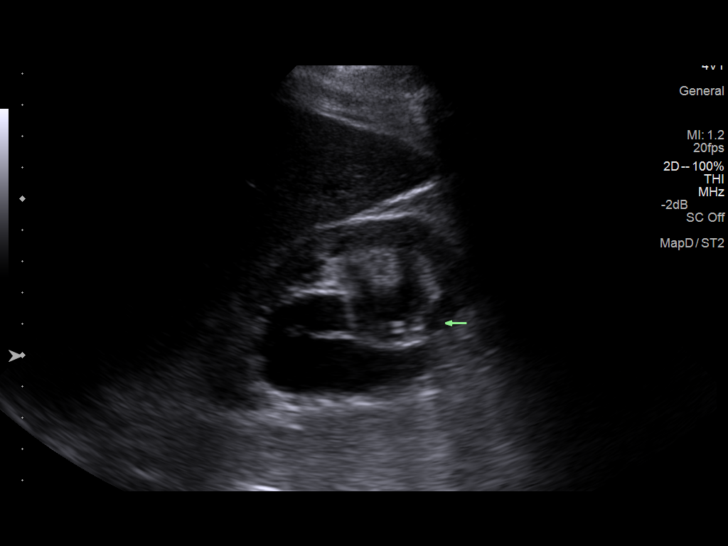
[im 17/45]
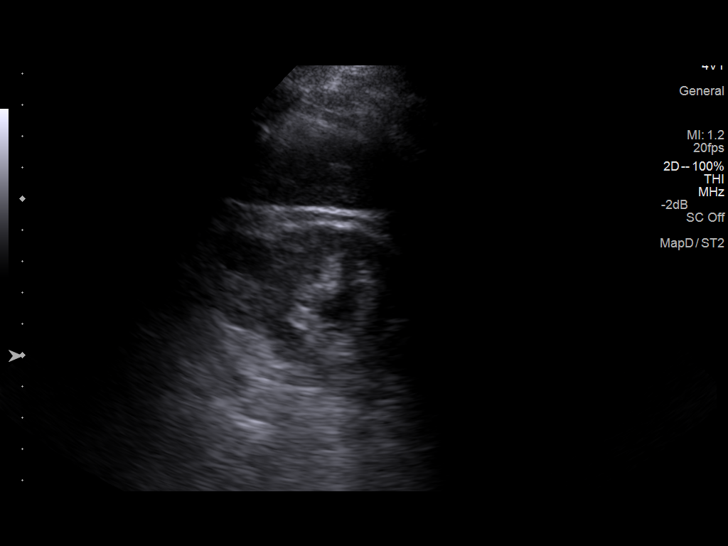
[im 21/45]
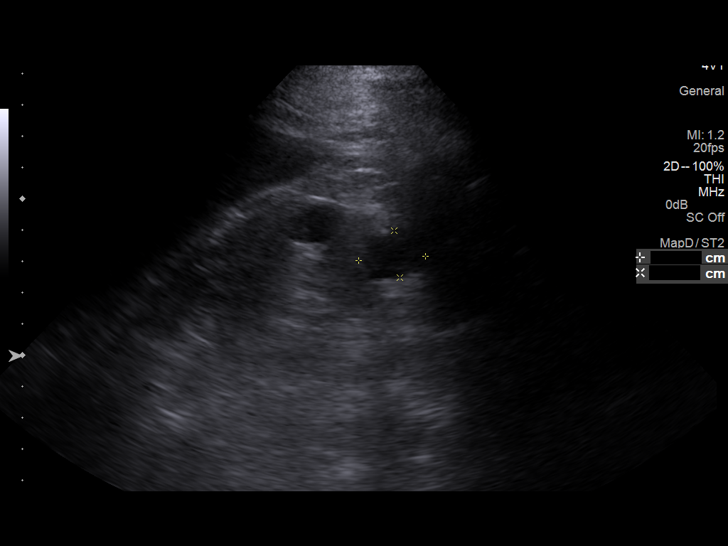
[im 24/45]
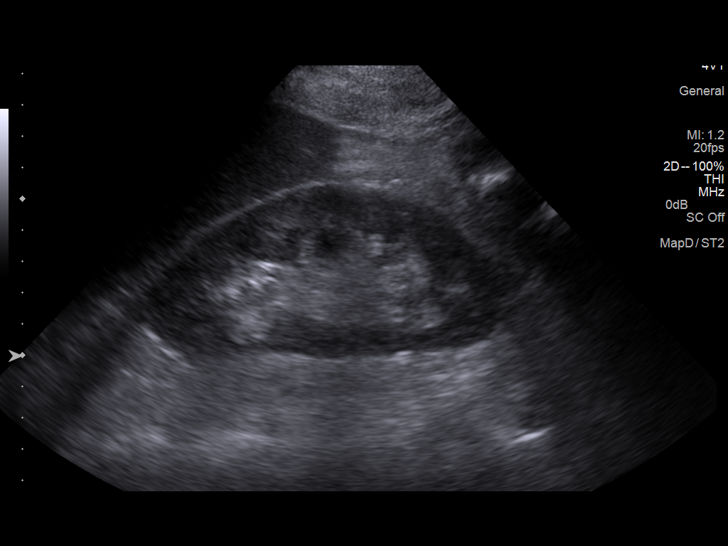
[im 28/45]
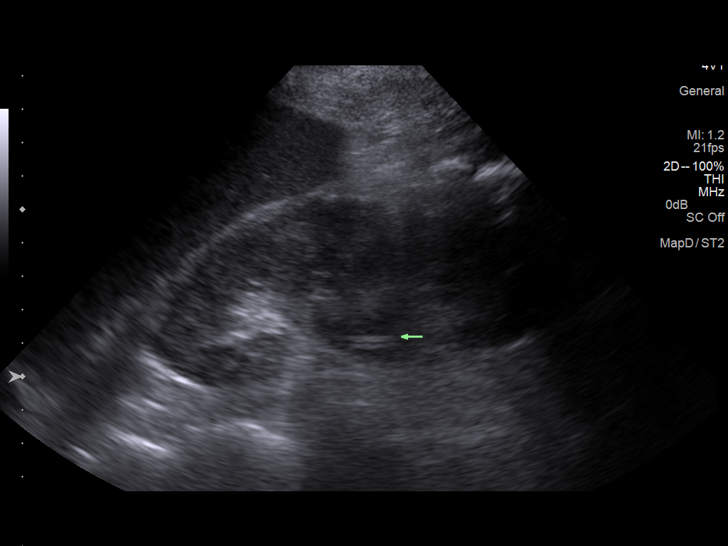
[im 30/45]
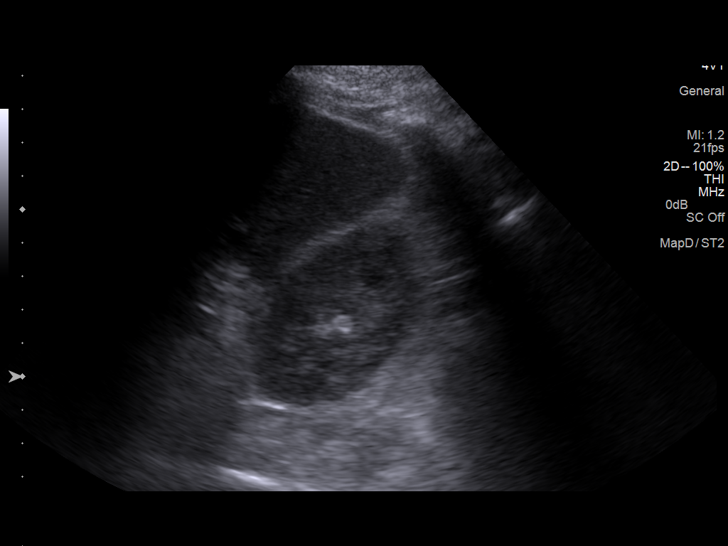
[im 34/45]
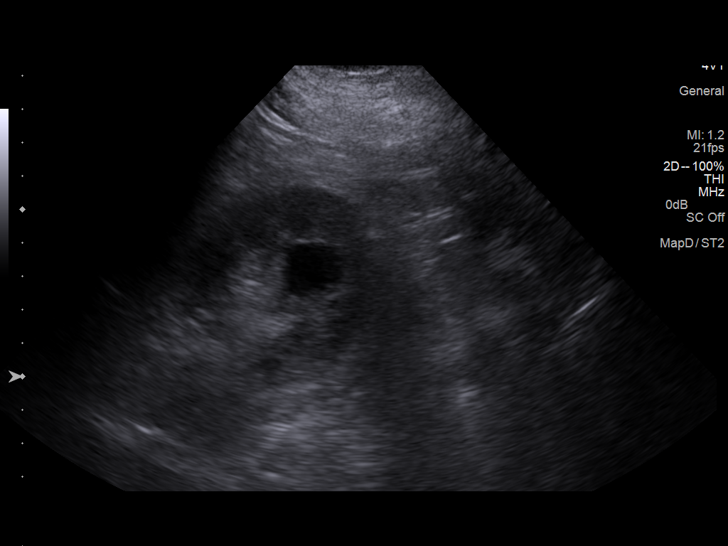
[im 37/45]
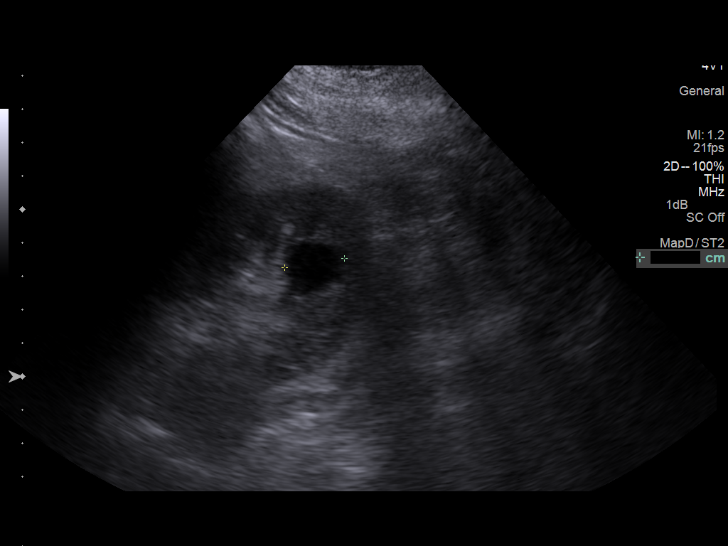
[im 41/45]
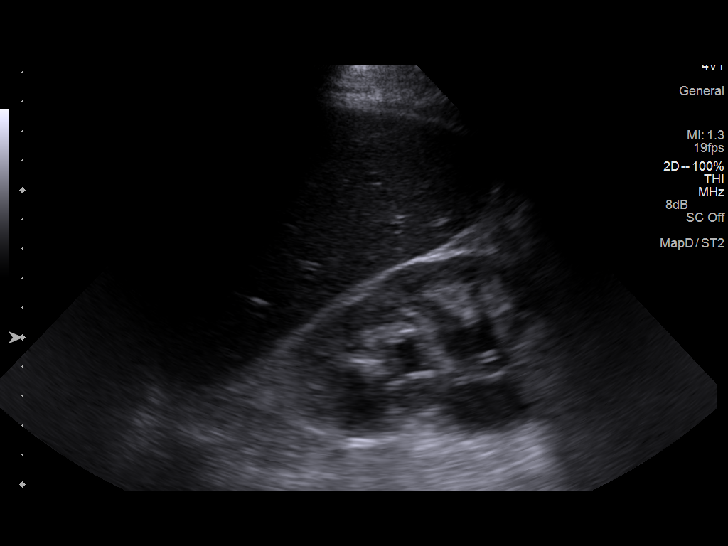
[im 45/45]
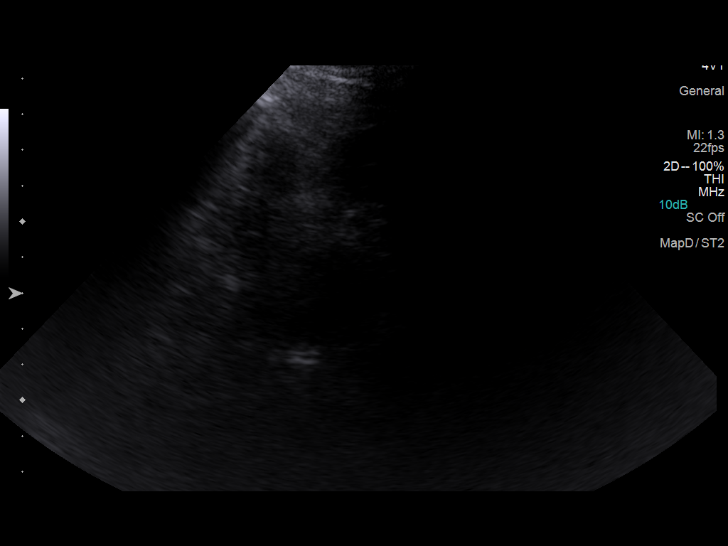

[14 of 25 positions shown; findings below may reference images not displayed]

FINDINGS: Right Kidney:

Length: 11.0 cm. The right renal collecting system is markedly
dilated. There is a ureter stent within the right kidney. In the
lower pole of the right kidney, there are 2 round hypoechoic
structures that are suggestive for cysts. Largest cyst measures
cm. There is marked dilatation of the right renal pelvis. Trace
amount of right perinephric fluid.

Left Kidney:

Length: 11.7 cm.. There is a ureter stent in the left renal pelvis.
No significant left hydronephrosis. In the lower pole, there is a
round hypoechoic structure that measures up to 1.9 cm and suggestive
for a cyst. Evidence for left pleural fluid.

Bladder:

Surgically removed.
IMPRESSION: Moderate right hydronephrosis.

Bilateral ureter stents.

Left pleural effusion.

Bilateral renal cysts.
